# Patient Record
Sex: Female | Born: 2000 | Race: Black or African American | Hispanic: No | Marital: Single | Smoking: Never smoker
Health system: Southern US, Community
[De-identification: ages and names within clinical notes are randomized; demographics above are authoritative.]

## PROBLEM LIST (undated history)

## (undated) DIAGNOSIS — J45909 Unspecified asthma, uncomplicated: Secondary | ICD-10-CM

## (undated) HISTORY — PX: CYSTECTOMY: SUR359

---
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
## 2022-02-23 ENCOUNTER — Encounter (INDEPENDENT_AMBULATORY_CARE_PROVIDER_SITE_OTHER): Payer: Self-pay | Admitting: Pediatrics

## 2022-03-17 ENCOUNTER — Ambulatory Visit (INDEPENDENT_AMBULATORY_CARE_PROVIDER_SITE_OTHER): Payer: Medicaid Other | Admitting: Pediatrics

## 2022-03-17 ENCOUNTER — Encounter (INDEPENDENT_AMBULATORY_CARE_PROVIDER_SITE_OTHER): Payer: Self-pay | Admitting: Pediatrics

## 2022-03-17 VITALS — BP 106/66 | HR 76 | Ht 68.47 in | Wt 119.0 lb

## 2022-03-17 DIAGNOSIS — E1065 Type 1 diabetes mellitus with hyperglycemia: Secondary | ICD-10-CM | POA: Diagnosis not present

## 2022-03-17 DIAGNOSIS — E10649 Type 1 diabetes mellitus with hypoglycemia without coma: Secondary | ICD-10-CM | POA: Diagnosis not present

## 2022-03-17 DIAGNOSIS — E109 Type 1 diabetes mellitus without complications: Secondary | ICD-10-CM

## 2022-03-17 DIAGNOSIS — Z978 Presence of other specified devices: Secondary | ICD-10-CM | POA: Diagnosis not present

## 2022-03-17 LAB — POCT GLYCOSYLATED HEMOGLOBIN (HGB A1C): Hemoglobin A1C: 8.7 % — AB (ref 4.0–5.6)

## 2022-03-17 LAB — POCT GLUCOSE (DEVICE FOR HOME USE): Glucose Fasting, POC: 271 mg/dL — AB (ref 70–99)

## 2022-03-17 MED ORDER — FIASP FLEXTOUCH 100 UNIT/ML ~~LOC~~ SOPN: PEN_INJECTOR | SUBCUTANEOUS | 5 refills | Status: DC

## 2022-03-17 MED ORDER — LANTUS SOLOSTAR 100 UNIT/ML ~~LOC~~ SOPN: PEN_INJECTOR | SUBCUTANEOUS | 3 refills | Status: DC

## 2022-03-17 NOTE — Patient Instructions (Signed)
DISCHARGE INSTRUCTIONS FOR Diane [name]  03/17/2022  HbA1c Goals: Our ultimate goal is to achieve the lowest possible HbA1c while avoiding recurrent severe hypoglycemia.  However all HbA1c goals must be individualized per the American Diabetes Association Clinical Standards.  My Hemoglobin A1c History:  Lab Results  Component Value Date   HGBA1C 8.7 (A) 03/17/2022   HGBA1C 5.8 (A) 07/10/2021   HGBA1C 5.8 (A) 04/08/2021   HGBA1C 9.0 (A) 12/06/2020   HGBA1C 7.7 (A) 09/03/2020   HGBA1C >15.5 (H) 03/10/2020   HGBA1C >15.5 (H) 03/10/2020    My goal HbA1c is: < 7 %  This is equivalent to an average blood glucose of:  HbA1c % = Average BG  6  120   7  150   8  180   9  210   10  240   11  270   12  300   13  330    For pump site and CGM sites: Try adhesives and new site on back of arms and abdomen  -Remember not to enter glucose into Bolus Calc if your glucose is less than 120mg /dL Insulin:   DAILY SCHEDULE- In Case of Pump Failure, use Bolus Calc  Give Long Acting Insulin ASAP: 8 (can increase to 10) units of (Lantus/Glargine/Basaglar,Tresiba) every 24 hours   Breakfast: Get up Check Glucose Take insulin (Humalog (Lyumjev)/Novolog(FiASP)/)Apidra/Admelog) and then eat Give carbohydrate ratio: 1 unit for every 15 grams of carbs (# carbs divided by 15) Give correction if glucose > 120 mg/dL, [Glucose - divided by [75] Lunch: Check Glucose Take insulin (Humalog (Lyumjev)/Novolog(FiASP)/)Apidra/Admelog) and then eat Give carbohydrate ratio: 1 unit for every 15 grams of carbs (# carbs divided by 15) Give correction if glucose > 120 mg/dL (see table) Afternoon: If snack is eaten (optional): 1 unit for every 15 grams of carbs (# carbs divided by 15) Dinner: Check Glucose Take insulin (Humalog (Lyumjev)/Novolog(FiASP)/)Apidra/Admelog) and then eat Give carbohydrate ratio: 1 unit for every 20 grams of carbs (# carbs divided by 20) Give correction if glucose > 120 mg/dL (see  table) Bed: Check Glucose (Juice first if BG is less than__70 mg/dL____) Give correction if glucose > 160 mg/dL   -If glucose is 465] mg/dL or more, if snack is desired, then give carb ratio + HALF   correction dose         -If glucose is 125 mg/dL or less, give snack without insulin. NEVER go to bed with a glucose less than 90 mg/dL.  **Remember: Carbohydrate + Correction Dose = units of rapid acting insulin before eating **     Medications:  Continue as currently prescribed  Please allow 3 days for prescription refill requests! After hours are for emergencies only.   Check Blood Glucose:  Before breakfast, before lunch, before dinner, at bedtime, and for symptoms of high or low blood glucose as a minimum.  Check BG 2 hours after meals if adjusting doses.   Check more frequently on days with more activity than normal.   Check in the middle of the night when evening insulin doses are changed, on days with extra activity in the evening, and if you suspect overnight low glucoses are occurring.   Send a MyChart message as needed for patterns of high or low glucose levels, or multiple low glucoses.  As a general rule, ALWAYS call [phone] to review your child's blood glucoses IF: Your child has a seizure You have to use glucagon/Baqsimi/Gvoke or glucose gel to bring  up the blood sugar  IF you notice a pattern of high blood sugars  If in a week, your child has: 1 blood glucose that is 40 or less  2 blood glucoses that are 50 or less at the same time of day 3 blood glucoses that are 60 or less at the same time of day  Phone: [phone]  Ketones: Check urine or blood ketones if blood glucose is greater than 300 mg/dL (injections) or 240 mg/dL (pump), when ill, or if having symptoms of ketones.  Call if Urine Ketones are moderate or large Call if Blood Ketones are moderate (1-1.5) or large (more than1.5)  Exercise Plan:  Any activity that makes you sweat most days for 60 minutes.    Safety: Wear Medical Alert at [place] requesting the Yellow Dot Packages should contact [facility] at the [facility] by calling [phone] or e-mail [email or web address].  TEEN REMINDERS:  Check blood glucose before driving If sexually active, use reliable birth control including condoms.  Alcohol in moderation only - check glucoses more frequently, & have a snack with no carb coverage. Glucose gel/cake icing for low glucose. Check glucoses in the middle of the night.  Other: Schedule an eye exam yearly and a dental exam and cleaning every 6 months. Get a flu vaccine yearly, and Covid-19 vaccine unless contraindicated.

## 2022-03-17 NOTE — Progress Notes (Signed)
Pediatric Endocrinology Diabetes Consultation Follow-up Visit  Diane [name] Sep 18, 2000 [id number]  Chief Complaint: Follow-up Type 1 Diabetes    [name], MD   HPI: Diane [name]  is a 21 y.o. female presenting for follow-up of Type 1 Diabetes diagnosed 03/11/2020 with new onset diabetes after being seen by PCP one day prior to abdominal pain and sore throat.   In the ED she was found to have a blood glucose of 550 with BHB >8. Her pH was 7.01 with bicarb <7. She was admitted to the PICU for insulin GGT. She was started on Lantus and Novolog MDI and received diabetes education prior to discharge. 03/10/2020- Insulin ab <5, ICA neg, GAD 1,417.2. c.peptide 0.6. Omnipod 5 was  started 02/20/2021. She has contact dermatitis with Omnipod adhesive improved with flonase. She has nocturnal hypoglycemia with menses. she is accompanied to this visit by her mother.  Since last visit on 11/15/21, she has been well.  No ER visits or hospitalizations. She is attending college at [employer]. She stopped track. Omnipod has been falling off more and she has been using her legs, but not tried anywhere else. PDM is not charged and dead. No new inpen. She also feels CGM has been falling off more too. She is frustrated. She is giving fixed doses and taking pump breaks. She does not have any pods at home, and did not tell her mother. She still does not have her license.   Insulin regimen:  Basal: Lantus 4 units Bolus: Novo Fixed dose 7 units at breakfast, none at lunch, 7-8 dinner Eating in between doses.          Hypoglycemia: can feel most low blood sugars.  No glucagon needed recently.  Blood glucose download: Accucheck Guide meter CGM download: Using Dexcom G6 continuous glucose monitor   Med-alert ID: is currently wearing. Injection/Pump sites: lower extremity Annual labs due: October 2023 Annual Foot Exam: 11/15/21- nl Ophthalmology due: 2023 no retinopathy. Flu vaccine: 2022 COVID vaccine: x2,  Covid-19 x1  3. ROS: Greater than 10 systems reviewed with pertinent positives listed in HPI, otherwise neg.  The following portions of the patient's history were reviewed and updated as appropriate:  Past Medical History:  Past Medical History:  Diagnosis Date   Asthma     Medications:  Outpatient Encounter Medications as of 03/17/2022  Medication Sig   Accu-Chek FastClix Lancets MISC Check sugar up to 6 times daily. For use with FAST CLIX Lancet Device   albuterol (PROVENTIL HFA;VENTOLIN HFA) 108 (90 Base) MCG/ACT inhaler Inhale 2 puffs into the lungs every 4 (four) hours as needed for wheezing or shortness of breath.   Continuous Blood Gluc Sensor (DEXCOM G6 SENSOR) MISC Inject 1 applicator into the skin as directed. (change sensor every 10 days)   Continuous Blood Gluc Transmit (DEXCOM G6 TRANSMITTER) MISC Inject 1 Device into the skin as directed. (re-use up to 8x with each new sensor)   glucose blood (ACCU-CHEK GUIDE) test strip Use as instructed for 6 checks per day plus per protocol for hyper/hypoglycemia   insulin aspart (FIASP FLEXTOUCH) 100 UNIT/ML FlexTouch Pen Inject up to 50 units subcutaneously daily as instructed.   Insulin Aspart, w/Niacinamide, (FIASP) 100 UNIT/ML SOLN Inject up to 200 units every 2-3 days. Please fill for [facility] [facility].   Insulin Disposable Pump (OMNIPOD 5 G6 POD, GEN 5,) MISC Inject 1 Device into the skin as directed. Change pod every 2 days. Patient will need 3 boxes (each contain 5 pods) for a 30  day supply. Please fill for [facility] [id number].   acetone, urine, test strip Check ketones per protocol (Patient not taking: Reported on 03/26/2020)   Blood Glucose Monitoring Suppl (ACCU-CHEK GUIDE) w/Device KIT 1 each by Does not apply route as directed. (Patient not taking: Reported on 09/03/2020)   cetirizine (ZYRTEC) 10 MG tablet Take 10 mg by mouth daily as needed for allergies. (Patient not taking: Reported on 03/26/2020)   Continuous Blood Gluc Receiver  (DEXCOM G6 RECEIVER) DEVI 1 Device by Does not apply route as directed. (Patient not taking: Reported on 03/17/2022)   ferrous sulfate 325 (65 FE) MG tablet Take 1 tablet (325 mg total) by mouth daily. (Patient not taking: Reported on 07/10/2021)   Glucagon (BAQSIMI TWO PACK) 3 MG/DOSE POWD Place 1 each into the nose as needed (severe hypoglycmia with unresponsiveness). (Patient not taking: Reported on 04/23/2020)   hydrocortisone 1 % ointment Apply 1 application. topically 2 (two) times daily. (Patient not taking: Reported on 11/15/2021)   injection device for insulin (INPEN 100-GREY-NOVO) DEVI Use InPen with insulin cartridges to inject insulin up to 8x per day. (Patient not taking: Reported on 01/07/2021)   insulin glargine (LANTUS SOLOSTAR) 100 UNIT/ML Solostar Pen Up to 50 units per day as directed by MD, in case of pump failure.   Insulin Pen Needle (INSUPEN PEN NEEDLES) 32G X 4 MM MISC BD Pen Needles- brand specific. Inject insulin via insulin pen 6 x daily (Patient not taking: Reported on 02/20/2021)   Lancets Misc. (ACCU-CHEK FASTCLIX LANCET) KIT Check sugar 6 times daily (Patient not taking: Reported on 04/23/2020)   ondansetron (ZOFRAN-ODT) 4 MG disintegrating tablet Take 1 tablet (4 mg total) by mouth every 8 (eight) hours as needed for nausea or vomiting. (Patient not taking: Reported on 07/10/2021)   selenium sulfide (SELSUN) 2.5 % shampoo Apply topically. (Patient not taking: Reported on 01/07/2021)   [DISCONTINUED] insulin glargine (LANTUS SOLOSTAR) 100 UNIT/ML Solostar Pen Up to 50 units per day as directed by MD, in case of pump failure. (Patient not taking: Reported on 11/15/2021)   No facility-administered encounter medications on file as of 03/17/2022.    Allergies: No Known Allergies  Surgical History: Past Surgical History:  Procedure Laterality Date   CYSTECTOMY      Family History:  Family History  Problem Relation Age of Onset   Asthma Sister    Thyroid disease Maternal Aunt     Thyroid disease Maternal Grandmother    Anemia Maternal Grandmother    Asthma Maternal Grandfather    Diabetes Paternal Grandmother     Social History: Social History   Social History Narrative   Lives with parents and 4 siblings   Graduates May 2022. Going to [employer] for physical therapy.     Physical Exam:  Vitals:   03/17/22 0926  BP: 106/66  Pulse: 76  Weight: 119 lb (54 kg)  Height: 5' 8.47" (1.739 m)   BP 106/66   Pulse 76   Ht 5' 8.47" (1.739 m)   Wt 119 lb (54 kg)   LMP 03/12/2022   BMI 17.85 kg/m  Body mass index: body mass index is 17.85 kg/m. Blood pressure %iles are not available for patients who are 18 years or older.  Ht Readings from Last 3 Encounters:  03/17/22 5' 8.47" (1.739 m) (95 %, Z= 1.65)*  11/15/21 5' 8.47" (1.739 m) (95 %, Z= 1.65)*  10/01/21 5' 8.5" (1.74 m) (95 %, Z= 1.67)*   * Growth percentiles are based on CDC (Girls,  2-20 Years) data.   Wt Readings from Last 3 Encounters:  03/17/22 119 lb (54 kg) (35 %, Z= -0.38)*  11/15/21 122 lb 3.2 oz (55.4 kg) (44 %, Z= -0.16)*  10/01/21 125 lb 12.8 oz (57.1 kg) (51 %, Z= 0.04)*   * Growth percentiles are based on CDC (Girls, 2-20 Years) data.    Physical Exam Vitals reviewed.  Constitutional:      Appearance: Normal appearance. She is not toxic-appearing.  HENT:     Head: Normocephalic and atraumatic.     Nose: Nose normal.     Mouth/Throat:     Mouth: Mucous membranes are moist.  Eyes:     Extraocular Movements: Extraocular movements intact.  Neck:     Comments: No goiter Cardiovascular:     Heart sounds: Normal heart sounds.  Pulmonary:     Effort: Pulmonary effort is normal. No respiratory distress.     Breath sounds: Normal breath sounds.  Abdominal:     General: There is no distension.  Musculoskeletal:        General: Normal range of motion.     Cervical back: Normal range of motion and neck supple.  Skin:    Capillary Refill: Capillary refill takes less than 2 seconds.      Findings: No rash.     Comments: No lipohypertrophy.   Neurological:     General: No focal deficit present.     Mental Status: She is alert.     Gait: Gait normal.  Psychiatric:        Mood and Affect: Mood normal.        Behavior: Behavior normal.        Thought Content: Thought content normal.        Judgment: Judgment normal.      Labs: Lab Results  Component Value Date   ISLETAB Negative 03/10/2020  ,  Lab Results  Component Value Date   INSULINAB <5.0 03/10/2020  ,  Lab Results  Component Value Date   GLUTAMICACAB 1,417.2 (H) 03/10/2020  , No results found for: "ZNT8AB" No results found for: "LABIA2"  Last hemoglobin A1c:  Lab Results  Component Value Date   HGBA1C 8.7 (A) 03/17/2022   Results for orders placed or performed in visit on 03/17/22  POCT Glucose (Device for Home Use)  Result Value Ref Range   Glucose Fasting, POC 271 (A) 70 - 99 mg/dL   POC Glucose    POCT glycosylated hemoglobin (Hb A1C)  Result Value Ref Range   Hemoglobin A1C 8.7 (A) 4.0 - 5.6 %   HbA1c POC (<> result, manual entry)     HbA1c, POC (prediabetic range)     HbA1c, POC (controlled diabetic range)      Lab Results  Component Value Date   HGBA1C 8.7 (A) 03/17/2022   HGBA1C 5.8 (A) 07/10/2021   HGBA1C 5.8 (A) 04/08/2021    Lab Results  Component Value Date   MICROALBUR 1.6 04/08/2021   [place] 78 04/08/2021   CREATININE 0.85 04/08/2021    Assessment/Plan: Diane [name] is a 21 y.o. female with The primary encounter diagnosis was Uncontrolled type 1 diabetes mellitus with hyperglycemia ([place]). A diagnosis of Uses self-applied continuous glucose monitoring device was also pertinent to this visit.. Diabetes mellitus Type I, under poor control.A1c is above goal of 7% or lower and TIR is below goal of over 70%.  She is no longer running and less insulin sensitive, so she needs more basal insulin. She is complaining of  devices not sticking to her, though she has stopped track. We  discussed adhesive supplements and sample of Skin Tac provided. Also, handout on other adhesive supplements that can be used were also provided. I am not sure how she is receiving her rapid acting insulin as PDM is dead, she is not wearing a pod, and Inpen is expired. She received a sample FiASP pen and prescriptions for long acting and rapid acting insulin were provided. BolusCalc was downloaded to her phone and daily schedule provided. She has MyFitnessPal to carb count. We discussed how fixed doses were leading to hyperglycemia and then midafternoon hypoglycemia. She agreed to keep a diary on her phone of insulin doses and carbs in her Dexcom G6 app.  When a patient is on insulin, intensive monitoring of blood glucose levels and continuous insulin titration is vital to avoid hyperglycemia and hypoglycemia. Severe hypoglycemia can lead to seizure or death. Hyperglycemia can lead to ketosis requiring ICU admission and intravenous insulin.   -Annual studies fasting today as below:  Orders Placed This Encounter  Procedures   Microalbumin / creatinine urine ratio   Lipid panel   T4, free   TSH   POCT Glucose (Device for Home Use)   POCT glycosylated hemoglobin (Hb A1C)   COLLECTION CAPILLARY BLOOD SPECIMEN    Meds ordered this encounter  Medications   insulin glargine (LANTUS SOLOSTAR) 100 UNIT/ML Solostar Pen    Sig: Up to 50 units per day as directed by MD, in case of pump failure.    Dispense:  15 mL    Refill:  3    3 ml per pen. 5 pens per box. Please dispense 1 box of pens. For questions regarding this prescription please call [phone].   insulin aspart (FIASP FLEXTOUCH) 100 UNIT/ML FlexTouch Pen    Sig: Inject up to 50 units subcutaneously daily as instructed.    Dispense:  15 mL    Refill:  5     Patient Instructions  DISCHARGE INSTRUCTIONS FOR Diane [name]  03/17/2022  HbA1c Goals: Our ultimate goal is to achieve the lowest possible HbA1c while avoiding recurrent severe  hypoglycemia.  However all HbA1c goals must be individualized per the American Diabetes Association Clinical Standards.  My Hemoglobin A1c History:  Lab Results  Component Value Date   HGBA1C 8.7 (A) 03/17/2022   HGBA1C 5.8 (A) 07/10/2021   HGBA1C 5.8 (A) 04/08/2021   HGBA1C 9.0 (A) 12/06/2020   HGBA1C 7.7 (A) 09/03/2020   HGBA1C >15.5 (H) 03/10/2020   HGBA1C >15.5 (H) 03/10/2020    My goal HbA1c is: < 7 %  This is equivalent to an average blood glucose of:  HbA1c % = Average BG  6  120   7  150   8  180   9  210   10  240   11  270   12  300   13  330    For pump site and CGM sites: Try adhesives and new site on back of arms and abdomen  -Remember not to enter glucose into Bolus Calc if your glucose is less than 190m/dL Insulin:   DAILY SCHEDULE- In Case of Pump Failure, use Bolus Calc  Give Long Acting Insulin ASAP: 8 (can increase to 10) units of (Lantus/Glargine/Basaglar,Tresiba) every 24 hours   Breakfast: Get up Check Glucose Take insulin (Humalog (Lyumjev)/Novolog(FiASP)/)Apidra/Admelog) and then eat Give carbohydrate ratio: 1 unit for every 15 grams of carbs (# carbs divided by 15) Give correction  if glucose > 120 mg/dL, [Glucose - 120] divided by [75] Lunch: Check Glucose Take insulin (Humalog (Lyumjev)/Novolog(FiASP)/)Apidra/Admelog) and then eat Give carbohydrate ratio: 1 unit for every 15 grams of carbs (# carbs divided by 15) Give correction if glucose > 120 mg/dL (see table) Afternoon: If snack is eaten (optional): 1 unit for every 15 grams of carbs (# carbs divided by 15) Dinner: Check Glucose Take insulin (Humalog (Lyumjev)/Novolog(FiASP)/)Apidra/Admelog) and then eat Give carbohydrate ratio: 1 unit for every 20 grams of carbs (# carbs divided by 20) Give correction if glucose > 120 mg/dL (see table) Bed: Check Glucose (Juice first if BG is less than__70 mg/dL____) Give correction if glucose > 160 mg/dL   -If glucose is 125 mg/dL or more, if  snack is desired, then give carb ratio + HALF   correction dose         -If glucose is 125 mg/dL or less, give snack without insulin. NEVER go to bed with a glucose less than 90 mg/dL.  **Remember: Carbohydrate + Correction Dose = units of rapid acting insulin before eating **     Medications:  Continue as currently prescribed  Please allow 3 days for prescription refill requests! After hours are for emergencies only.   Check Blood Glucose:  Before breakfast, before lunch, before dinner, at bedtime, and for symptoms of high or low blood glucose as a minimum.  Check BG 2 hours after meals if adjusting doses.   Check more frequently on days with more activity than normal.   Check in the middle of the night when evening insulin doses are changed, on days with extra activity in the evening, and if you suspect overnight low glucoses are occurring.   Send a MyChart message as needed for patterns of high or low glucose levels, or multiple low glucoses.  As a general rule, ALWAYS call us to review your child's blood glucoses IF: Your child has a seizure You have to use glucagon/Baqsimi/Gvoke or glucose gel to bring up the blood sugar  IF you notice a pattern of high blood sugars  If in a week, your child has: 1 blood glucose that is 40 or less  2 blood glucoses that are 50 or less at the same time of day 3 blood glucoses that are 60 or less at the same time of day  Phone: [phone]  Ketones: Check urine or blood ketones if blood glucose is greater than 300 mg/dL (injections) or 240 mg/dL (pump), when ill, or if having symptoms of ketones.  Call if Urine Ketones are moderate or large Call if Blood Ketones are moderate (1-1.5) or large (more than1.5)  Exercise Plan:  Any activity that makes you sweat most days for 60 minutes.   Safety: Wear Medical Alert at [place] requesting the Yellow Dot Packages should contact [facility] at the [facility] by  calling [phone] or e-mail [email or web address].  TEEN REMINDERS:  Check blood glucose before driving If sexually active, use reliable birth control including condoms.  Alcohol in moderation only - check glucoses more frequently, & have a snack with no carb coverage. Glucose gel/cake icing for low glucose. Check glucoses in the middle of the night.  Other: Schedule an eye exam yearly and a dental exam and cleaning every 6 months. Get a flu vaccine yearly, and Covid-19 vaccine unless contraindicated.        Follow-up:   Return in about 2 weeks (around 03/31/2022), or if symptoms worsen or fail to  improve, for follow up.    Medical decision-making:  I spent 49 minutes dedicated to the care of this patient on the date of this encounter to include pre-visit review of laboratory studies, glucose logs/continuous glucose monitor logs, pump downloads,  medically appropriate exam, face-to-face time with the patient, ordering of medications, ordering of tests, and documenting in the EHR.  Thank you for the opportunity to participate in the care of our mutual patient. Please do not hesitate to contact me should you have any questions regarding the assessment or treatment plan.   Sincerely,   Al Corpus, MD

## 2022-03-18 LAB — LIPID PANEL
Cholesterol: 194 mg/dL — ABNORMAL HIGH (ref <170)
HDL: 57 mg/dL (ref >45)
LDL Cholesterol (Calc): 118 mg/dL (calc) — ABNORMAL HIGH (ref <110)
Non-HDL Cholesterol (Calc): 137 mg/dL (calc) — ABNORMAL HIGH (ref <120)
Total CHOL/HDL Ratio: 3.4 (calc) (ref <5.0)
Triglycerides: 87 mg/dL (ref <90)

## 2022-03-18 LAB — MICROALBUMIN / CREATININE URINE RATIO
Creatinine, Urine: 131 mg/dL (ref 20–275)
Microalb Creat Ratio: 2 mcg/mg creat (ref <30)
Microalb, Ur: 0.2 mg/dL

## 2022-03-18 LAB — T4, FREE: Free T4: 1.2 ng/dL (ref 0.8–1.4)

## 2022-03-18 LAB — TSH: TSH: 1.66 mIU/L

## 2022-03-31 ENCOUNTER — Ambulatory Visit (INDEPENDENT_AMBULATORY_CARE_PROVIDER_SITE_OTHER): Payer: Self-pay | Admitting: Pediatrics

## 2022-03-31 ENCOUNTER — Encounter (INDEPENDENT_AMBULATORY_CARE_PROVIDER_SITE_OTHER): Payer: Self-pay | Admitting: Pediatrics

## 2022-03-31 ENCOUNTER — Encounter (INDEPENDENT_AMBULATORY_CARE_PROVIDER_SITE_OTHER): Payer: Self-pay

## 2022-04-01 ENCOUNTER — Ambulatory Visit (INDEPENDENT_AMBULATORY_CARE_PROVIDER_SITE_OTHER): Payer: Medicaid Other | Admitting: Pediatrics

## 2022-04-01 ENCOUNTER — Encounter (INDEPENDENT_AMBULATORY_CARE_PROVIDER_SITE_OTHER): Payer: Self-pay | Admitting: Pediatrics

## 2022-04-01 VITALS — BP 112/70 | HR 97 | Wt 109.8 lb

## 2022-04-01 DIAGNOSIS — E1065 Type 1 diabetes mellitus with hyperglycemia: Secondary | ICD-10-CM | POA: Diagnosis not present

## 2022-04-01 DIAGNOSIS — R824 Acetonuria: Secondary | ICD-10-CM

## 2022-04-01 DIAGNOSIS — E785 Hyperlipidemia, unspecified: Secondary | ICD-10-CM | POA: Diagnosis not present

## 2022-04-01 LAB — POCT URINALYSIS DIPSTICK (MANUAL)

## 2022-04-01 LAB — POCT GLUCOSE (DEVICE FOR HOME USE): POC Glucose: 516 mg/dl — AB (ref 70–99)

## 2022-04-01 NOTE — Progress Notes (Signed)
Pediatric Endocrinology Diabetes Consultation Follow-up Visit  Diane [name] [birth year] [id number]  Chief Complaint: Follow-up Type 1 Diabetes    [name], MD   HPI: Diane [name]  is a 21 y.o. female presenting for follow-up of Type 1 Diabetes diagnosed 03/11/2020 with new onset diabetes after being seen by PCP one day prior to abdominal pain and sore throat.   In the ED she was found to have a blood glucose of 550 with BHB >8. Her pH was 7.01 with bicarb <7. She was admitted to the PICU for insulin GGT. She was started on Lantus and Novolog MDI and received diabetes education prior to discharge. 03/10/2020- Insulin ab <5, ICA neg, GAD 1,417.2. c.peptide 0.6. Omnipod 5 was  started 02/20/2021. She has contact dermatitis with Omnipod adhesive improved with flonase. She has nocturnal hypoglycemia with menses. she is accompanied to this visit by her  sister .  Since last visit on 03/17/22, she has been well.  No ER visits or hospitalizations. Last dose of insulin 2-3 days ago. Thought she "didn't need long acting anymore." Denied SI/SH, but does not leave the house unless with family or for school. Declined to talk to someone. Does onto want to be admitted. Insulin regimen: Apps: Bolus calc and MyFitness pal --> documenting carbs in Dexcom app Basal: Lantus 8-10 units Bolus: Novo  CR: 15   ISF: 75  Target: 120 Not using Omnipod.          Hypoglycemia: can feel most low blood sugars.  No glucagon needed recently.  Blood glucose download: Accucheck Guide meter. Left meter at home, though my office reminded her yetserday to bring it today CGM download: Not wearing Dexcom G6 continuous glucose monitor   Med-alert ID: is currently wearing. Injection/Pump sites: lower extremity Annual labs due 03/2023: October 2023 with LDL 118, rest wnl. Next lipid panel January 2024 Annual Foot Exam: 11/15/21- nl Ophthalmology due: 2023 no retinopathy. Flu vaccine: 2022 COVID vaccine: x2, Covid-19  x1  3. ROS: Greater than 10 systems reviewed with pertinent positives listed in HPI, otherwise neg.  The following portions of the patient's history were reviewed and updated as appropriate:  Past Medical History:  Past Medical History:  Diagnosis Date   Asthma     Medications:  Outpatient Encounter Medications as of 04/01/2022  Medication Sig   Continuous Blood Gluc Sensor (DEXCOM G6 SENSOR) MISC Inject 1 applicator into the skin as directed. (change sensor every 10 days)   Continuous Blood Gluc Transmit (DEXCOM G6 TRANSMITTER) MISC Inject 1 Device into the skin as directed. (re-use up to 8x with each new sensor)   Insulin Aspart, w/Niacinamide, (FIASP) 100 UNIT/ML SOLN Inject up to 200 units every 2-3 days. Please fill for [employer] [facility].   Accu-Chek FastClix Lancets MISC Check sugar up to 6 times daily. For use with FAST CLIX Lancet Device (Patient not taking: Reported on 04/01/2022)   acetone, urine, test strip Check ketones per protocol (Patient not taking: Reported on 03/26/2020)   albuterol (PROVENTIL HFA;VENTOLIN HFA) 108 (90 Base) MCG/ACT inhaler Inhale 2 puffs into the lungs every 4 (four) hours as needed for wheezing or shortness of breath. (Patient not taking: Reported on 04/01/2022)   Blood Glucose Monitoring Suppl (ACCU-CHEK GUIDE) w/Device KIT 1 each by Does not apply route as directed. (Patient not taking: Reported on 09/03/2020)   cetirizine (ZYRTEC) 10 MG tablet Take 10 mg by mouth daily as needed for allergies. (Patient not taking: Reported on 03/26/2020)   Continuous Blood Gluc Receiver (  DEXCOM G6 RECEIVER) DEVI 1 Device by Does not apply route as directed. (Patient not taking: Reported on 03/17/2022)   ferrous sulfate 325 (65 FE) MG tablet Take 1 tablet (325 mg total) by mouth daily. (Patient not taking: Reported on 07/10/2021)   Glucagon (BAQSIMI TWO PACK) 3 MG/DOSE POWD Place 1 each into the nose as needed (severe hypoglycmia with unresponsiveness). (Patient not taking:  Reported on 04/23/2020)   glucose blood (ACCU-CHEK GUIDE) test strip Use as instructed for 6 checks per day plus per protocol for hyper/hypoglycemia (Patient not taking: Reported on 04/01/2022)   hydrocortisone 1 % ointment Apply 1 application. topically 2 (two) times daily. (Patient not taking: Reported on 11/15/2021)   injection device for insulin (INPEN 100-GREY-NOVO) DEVI Use InPen with insulin cartridges to inject insulin up to 8x per day. (Patient not taking: Reported on 01/07/2021)   insulin aspart (FIASP FLEXTOUCH) 100 UNIT/ML FlexTouch Pen Inject up to 50 units subcutaneously daily as instructed. (Patient not taking: Reported on 04/01/2022)   Insulin Disposable Pump (OMNIPOD 5 G6 POD, GEN 5,) MISC Inject 1 Device into the skin as directed. Change pod every 2 days. Patient will need 3 boxes (each contain 5 pods) for a 30 day supply. [facility] [id number]. (Patient not taking: Reported on 04/01/2022)   insulin glargine (LANTUS SOLOSTAR) 100 UNIT/ML Solostar Pen Up to 50 units per day as directed by MD, in case of pump failure. (Patient not taking: Reported on 04/01/2022)   Insulin Pen Needle (INSUPEN PEN NEEDLES) 32G X 4 MM MISC BD Pen Needles- brand specific. Inject insulin via insulin pen 6 x daily (Patient not taking: Reported on 02/20/2021)   Lancets Misc. (ACCU-CHEK FASTCLIX LANCET) KIT Check sugar 6 times daily (Patient not taking: Reported on 04/23/2020)   ondansetron (ZOFRAN-ODT) 4 MG disintegrating tablet Take 1 tablet (4 mg total) by mouth every 8 (eight) hours as needed for nausea or vomiting. (Patient not taking: Reported on 07/10/2021)   selenium sulfide (SELSUN) 2.5 % shampoo Apply topically. (Patient not taking: Reported on 01/07/2021)   No facility-administered encounter medications on file as of 04/01/2022.    Allergies: No Known Allergies  Surgical History: Past Surgical History:  Procedure Laterality Date   CYSTECTOMY      Family History:  Family History   Problem Relation Age of Onset   Asthma Sister    Thyroid disease Maternal Aunt    Thyroid disease Maternal Grandmother    Anemia Maternal Grandmother    Asthma Maternal Grandfather    Diabetes Paternal Grandmother     Social History: Social History   Social History Narrative   Lives with parents and 4 siblings   Graduates May 2022. Going to [employer] [facility] for physical therapy.     Physical Exam:  Vitals:   04/01/22 1354  BP: 112/70  Pulse: 97  Weight: 109 lb 12.8 oz (49.8 kg)   BP 112/70   Pulse 97   Wt 109 lb 12.8 oz (49.8 kg)   LMP 03/12/2022   BMI 16.47 kg/m  Body mass index: body mass index is 16.47 kg/m. Blood pressure %iles are not available for patients who are 18 years or older.  Ht Readings from Last 3 Encounters:  03/17/22 5' 8.47" (1.739 m) (95 %, Z= 1.65)*  11/15/21 5' 8.47" (1.739 m) (95 %, Z= 1.65)*  10/01/21 5' 8.5" (1.74 m) (95 %, Z= 1.67)*   * Growth percentiles are based on CDC (Girls, 2-20 Years) data.   Wt Readings from Last 3  Encounters:  04/01/22 109 lb 12.8 oz (49.8 kg) (17 %, Z= -0.97)*  03/17/22 119 lb (54 kg) (35 %, Z= -0.38)*  11/15/21 122 lb 3.2 oz (55.4 kg) (44 %, Z= -0.16)*   * Growth percentiles are based on CDC (Girls, 2-20 Years) data.    Physical Exam Vitals reviewed.  Constitutional:      Appearance: Normal appearance. She is not toxic-appearing.  HENT:     Head: Normocephalic and atraumatic.     Nose: Nose normal.     Mouth/Throat:     Mouth: Mucous membranes are moist.  Eyes:     Extraocular Movements: Extraocular movements intact.  Neck:     Comments: No goiter Cardiovascular:     Heart sounds: Normal heart sounds.  Pulmonary:     Effort: Pulmonary effort is normal. No respiratory distress.     Breath sounds: Normal breath sounds.  Abdominal:     General: There is no distension.  Musculoskeletal:        General: Normal range of motion.     Cervical back: Normal range of motion and neck supple.  Skin:    Capillary  Refill: Capillary refill takes less than 2 seconds.     Findings: No rash.     Comments: No lipohypertrophy.   Neurological:     General: No focal deficit present.     Mental Status: She is alert.     Gait: Gait normal.  Psychiatric:        Mood and Affect: Mood normal.        Behavior: Behavior normal.        Thought Content: Thought content normal.        Judgment: Judgment normal.      Labs: Lab Results  Component Value Date   ISLETAB Negative 03/10/2020  ,  Lab Results  Component Value Date   INSULINAB <5.0 03/10/2020  ,  Lab Results  Component Value Date   GLUTAMICACAB 1,417.2 (H) 03/10/2020  , No results found for: "ZNT8AB" No results found for: "LABIA2"  Last hemoglobin A1c:  Lab Results  Component Value Date   HGBA1C 8.7 (A) 03/17/2022   Results for orders placed or performed in visit on 04/01/22  POCT Glucose (Device for Home Use)  Result Value Ref Range   Glucose Fasting, POC     POC Glucose 516 (A) 70 - 99 mg/dl  POCT Urinalysis Dip Manual  Result Value Ref Range   Spec Grav, UA     pH, UA     Leukocytes, UA     Nitrite, UA     Poct Protein     Poct Glucose     Poct Ketones +++ large (A) Negative   Poct Urobilinogen     Poct Bilirubin     Poct Blood      Lab Results  Component Value Date   HGBA1C 8.7 (A) 03/17/2022   HGBA1C 5.8 (A) 07/10/2021   HGBA1C 5.8 (A) 04/08/2021    Lab Results  Component Value Date   MICROALBUR 0.2 03/17/2022   LDLCALC 118 (H) 03/17/2022   CREATININE 0.85 04/08/2021    Assessment/Plan: Diane [name] is a 21 y.o. female with The primary encounter diagnosis was Uncontrolled type 1 diabetes mellitus with hyperglycemia ([place]). Diagnoses of Ketonuria and Hyperlipidemia LDL goal <100 were also pertinent to this visit.. Diabetes mellitus Type I, under poor control.A1c is above goal of 7% or lower and TIR is below goal of over 70%.  She is no longer  running and less insulin sensitive, so she needs more basal insulin. She is  complaining of devices not sticking to her, though she has stopped track. We discussed adhesive supplements and sample of Skin Tac provided. Also, handout on other adhesive supplements that can be used were also provided. I am not sure how she is receiving her rapid acting insulin as PDM is dead, she is not wearing a pod, and Inpen is expired. She received a sample FiASP pen and prescriptions for long acting and rapid acting insulin were provided. BolusCalc was downloaded to her phone and daily schedule provided. She has MyFitnessPal to carb count. We discussed how fixed doses were leading to hyperglycemia and then midafternoon hypoglycemia. She agreed to keep a diary on her phone of insulin doses and carbs in her Dexcom G6 app.  When a patient is on insulin, intensive monitoring of blood glucose levels and continuous insulin titration is vital to avoid hyperglycemia and hypoglycemia. Severe hypoglycemia can lead to seizure or death. Hyperglycemia can lead to ketosis requiring ICU admission and intravenous insulin.   1. Uncontrolled type 1 diabetes mellitus with hyperglycemia ([facility]) -Gave Basaglar 10 units via sample pen. Good technique - COLLECTION CAPILLARY BLOOD SPECIMEN - POCT Glucose (Device for Home Use) -elevated  - POCT Urinalysis Dip Manual -c/o evolving depression, will refer to [facility] at next visit if provider hired  -alarm set on phone for 7AM to take long acting -She did not want a simplified insulin regimen and wanted to count carbs -provided education on diabetes and reviewed daily schedule (pictures on phone too) -reviewed sick day management and handout provided  2. Ketonuria -denied HA/abdominal pain. No kussmaul and appears hydrated --> no concerns of DKA -Gave 10 units of FiASP for large ketonuria and hyperglycemia and 20% TDD to prevent hospitalization -Snack provided in case of low on way home  3. Hyperlipidemia LDL goal <100 -LDL goal less than 100, she wants to work on  improving glycemic control before starting statin -Fasting lipid panel due January 2024  Orders Placed This Encounter  Procedures   POCT Glucose (Device for Home Use)   POCT Urinalysis Dip Manual   COLLECTION CAPILLARY BLOOD SPECIMEN    No orders of the defined types were placed in this encounter.    Patient Instructions  DISCHARGE INSTRUCTIONS FOR Diane [name]  04/01/2022  HbA1c Goals: Our ultimate goal is to achieve the lowest possible HbA1c while avoiding recurrent severe hypoglycemia.  However all HbA1c goals must be individualized per the American Diabetes Association Clinical Standards.  My Hemoglobin A1c History:  Lab Results  Component Value Date   HGBA1C 8.7 (A) 03/17/2022   HGBA1C 5.8 (A) 07/10/2021   HGBA1C 5.8 (A) 04/08/2021   HGBA1C 9.0 (A) 12/06/2020   HGBA1C 7.7 (A) 09/03/2020   HGBA1C >15.5 (H) 03/10/2020   HGBA1C >15.5 (H) 03/10/2020    My goal HbA1c is: < 7 %  This is equivalent to an average blood glucose of:  HbA1c % = Average BG  6  120   7  150   8  180   9  210   10  240   11  270   12  300   13  330    Insulin:  DIABETES PLAN  Rapid Acting Insulin (Novolog/FiASP (Aspart) and Humalog/Lyumjev (Lispro))  **Given for Food/Carbohydrates and High Sugar/Glucose**   DAYTIME (breakfast, lunch, dinner) Target Blood Glucose 125 mg/dL Insulin Sensitivity Factor 75 Insulin to Carb Ratio  1 unit  for 15 grams   Correction DOSE Food DOSE  (Glucose -Target)/Insulin Sensitivity Factor  Glucose (mg/dL) Units of Rapid Acting Insulin  Less than 125 0  126-200 1  201-275 2  276-350 3  351-425 4  426-500 5  501-575 6  576 or more 7   Number of carbohydrates divided by carb ratio  Number of Carbs Units of Rapid Acting Insulin  0-14 0  15-29 1  30-44 2  45-59 3  60-74 4  75-89 5  90-104 6  105-119 7  120-134 8  135-149 9  150-164 10  165-179 11  180-194 12  195+  (# carbs divided by 15)                 **Correction Dose + Food Dose =  Number of units of rapid acting insulin **  Correction for High Sugar/Glucose Food/Carbohydrate  Measure Blood Glucose BEFORE you eat. (Fingerstick with Glucose Meter or check the reading on your Continuous Glucose Meter).  Use the table above or calculate the dose using the formula.  Add this dose to the Food/Carbohydrate dose if eating a meal.  Correction should not be given sooner than every 3 hours since the last dose of rapid acting insulin. 1. Count the number of carbohydrates you will be eating.  2. Use the table above or calculate the dose using the formula.  3. Add this dose to the Correction dose if glucose is above target.         BEDTIME Target Blood Glucose 200 mg/dL Insulin Sensitivity Factor 75 Insulin to Carb Ratio  1 unit for 15 grams   Wait at least 3 hours after taking dinner dose of insulin BEFORE checking bedtime glucose.   Blood Sugar Less Than  178m/dL? Blood Sugar Between 126 - 2066mdL? Blood Sugar Greater Than 20077mL?  You MUST EAT 15 carbs  1. Carb snack not needed  Carb snack not needed    2. Additional, Optional Carb Snack?  If you want more carbs, you CAN eat them now! Make sure to subtract MUST EAT carbs from total carbs then look at chart below to determine food dose. 2. Optional Carb Snack?   You CAN eat this! Make sure to add up total carbs then look at chart below to determine food dose. 2. Optional Carb Snack?   You CAN eat this! Make sure to add up total carbs then look at chart below to determine food dose.  3. Correction Dose of Insulin?  NO  3. Correction Dose of Insulin?  NO 3. Correction Dose of Insulin?  YES; please look at correction dose chart to determine correction dose.   Glucose (mg/dL) Units of Rapid Acting Insulin  Less than 200 0  201-275 1  276-350 2  351-425 3  426-500 4  501-575 5  576 or more 6   Number of Carbs Units of Rapid Acting Insulin  0-14 0  15-29 1  30-44 2  45-59 3  60-74 4  75-89 5   90-104 6  105-119 7  120-134 8  135-149 9  150-164 10  165-179 11  180-194 12  195+  (# carbs divided by 15)          Long Acting Insulin (Glargine (Basaglar/Lantus/Semglee)/Levemir/Tresiba)  **Remember long acting insulin must be given EVERY DAY, and NEVER skip this dose**  Give 10 units at 7AM    If you have any questions/concerns PLEASE call [phone] to speak to the on-call  Pediatric Endocrinology provider at [facility].  Al Corpus, MD   DAILY SCHEDULE- Use Bolus Calc  Breakfast: Get up Check Glucose Take Lantus/Basaglar 10 units at 7AM Take insulin (Humalog (Lyumjev)/Novolog(FiASP)/)Apidra/Admelog) and then eat Give carbohydrate ratio: 1 unit for every 15 grams of carbs (# carbs divided by 15) Give correction if glucose > 120 mg/dL, [Glucose - 120] divided by [75] Lunch: Check Glucose Take insulin (Humalog (Lyumjev)/Novolog(FiASP)/)Apidra/Admelog) and then eat Give carbohydrate ratio: 1 unit for every 15 grams of carbs (# carbs divided by 15) Give correction if glucose > 120 mg/dL (see table) Afternoon: If snack is eaten (optional): 1 unit for every 15 grams of carbs (# carbs divided by 15) Dinner: Check Glucose Take insulin (Humalog (Lyumjev)/Novolog(FiASP)/)Apidra/Admelog) and then eat Give carbohydrate ratio: 1 unit for every 20 grams of carbs (# carbs divided by 20) Give correction if glucose > 120 mg/dL (see table) Bed: Check Glucose (Juice first if BG is less than__70 mg/dL____) Give correction if glucose > 160 mg/dL   -If glucose is 125 mg/dL or more, if snack is desired, then give carb ratio + HALF   correction dose         -If glucose is 125 mg/dL or less, give snack without insulin. NEVER go to bed with a glucose less than 90 mg/dL.  **Remember: Carbohydrate + Correction Dose = units of rapid acting insulin before eating **    Medications:   Continue as currently prescribed  Please allow  3 days for prescription refill requests! After hours are for emergencies only.   Check Blood Glucose:  Before breakfast, before lunch, before dinner, at bedtime, and for symptoms of high or low blood glucose as a minimum.  Check BG 2 hours after meals if adjusting doses.   Check more frequently on days with more activity than normal.   Check in the middle of the night when evening insulin doses are changed, on days with extra activity in the evening, and if you suspect overnight low glucoses are occurring.   Send a MyChart message as needed for patterns of high or low glucose levels, or multiple low glucoses.  As a general rule, ALWAYS call us to review your child's blood glucoses IF: Your child has a seizure You have to use glucagon/Baqsimi/Gvoke or glucose gel to bring up the blood sugar  IF you notice a pattern of high blood sugars  If in a week, your child has: 1 blood glucose that is 40 or less  2 blood glucoses that are 50 or less at the same time of day 3 blood glucoses that are 60 or less at the same time of day  Phone: [phone]  Ketones: Check urine or blood ketones if blood glucose is greater than 300 mg/dL (injections) or 240 mg/dL (pump), when ill, or if having symptoms of ketones.  Call if Urine Ketones are moderate or large Call if Blood Ketones are moderate (1-1.5) or large (more than1.5)  Exercise Plan:  Any activity that makes you sweat most days for 60 minutes.   Safety: Wear Medical Alert at [place] requesting the Yellow Dot Packages should contact [facility] at the [facility] by calling [phone] or e-mail [email or web address].  TEEN REMINDERS:  Check blood glucose before driving If sexually active, use reliable birth control including condoms.  Alcohol in moderation only -  check glucoses more frequently, & have a snack with no carb coverage. Glucose gel/cake icing for low glucose. Check glucoses in the  middle of the night.  Other: Schedule an eye exam yearly and a dental exam and cleaning every 6 months. Get a flu vaccine yearly, and Covid-19 vaccine unless contraindicated.   SICK DAY GUIDELINES  Remember the following 4 rules: Don't stop taking insulin--doses may need to be adjusted if not eating or blood sugar is low, but NEVER skip a dose! Correction dose of rapid acting insulin can be given every 3 hours. Check blood sugar levels more frequently--every 2 to 4 hours. Test for urine ketones EVERY time your child urinates. Give/offer lots of fluids/water.  If on a pump: "When in doubt, pull it out." Give insulin injection via insulin pen and needle Check urine for ketones Change pump site Give Ondansetron/Zofran if unable to keep down fluids Recheck glucose in 2-3 hours If not coming down, call the diabetes doctor  WHEN TO [place]: When an infection is suspected, fever, and for anything not related to diabetes  When to call the diabetes doctor: If your child vomits more than once or refuses food, If urine ketones are moderate or large, If blood sugars are over 200 most of the day or below 80, If steroids have been started for asthma (pediapred, orapred, prednisolone, prednisone).  Diarrhea and vomiting require replacement of fluids and minerals.  If your child is unable to eat solid foods because of nausea, clear liquids should be offered frequently.  It is important to keep your child well hydrated!    For blood sugars less than 150.  FLUIDS:     Foods: -Regular soda   -Regular jello -Gatorade   -Cooked cereal -PowerAde   -Plain yogurt -Juice    -Mashed potatoes -Regular popsicles  - cup ice cream or sherbet -Soup/broth   -toast/saltines    - banana   For blood sugars above 150.  FLUIDS:    Foods:    -Diet soda   -Sugar free jello -Zero Powerade/Gatorade  -Sugar free popsicles   -Water    -Sugar free foods  -Unsweetened tea -Other no-carb  fluids      Follow-up:   Return in about 4 weeks (around 04/29/2022), or if symptoms worsen or fail to improve, for follow up.    Medical decision-making:  I spent 49 minutes dedicated to the care of this patient on the date of this encounter to include pre-visit review of laboratory studies, glucose logs/continuous glucose monitor logs, pump downloads,  medically appropriate exam, face-to-face time with the patient, ordering of medications, ordering of tests, and documenting in the EHR.  Thank you for the opportunity to participate in the care of our mutual patient. Please do not hesitate to contact me should you have any questions regarding the assessment or treatment plan.   Sincerely,   Al Corpus, MD

## 2022-04-01 NOTE — Patient Instructions (Addendum)
DISCHARGE INSTRUCTIONS FOR Diane [name]  04/01/2022  HbA1c Goals: Our ultimate goal is to achieve the lowest possible HbA1c while avoiding recurrent severe hypoglycemia.  However all HbA1c goals must be individualized per the American Diabetes Association Clinical Standards.  My Hemoglobin A1c History:  Lab Results  Component Value Date   HGBA1C 8.7 (A) 03/17/2022   HGBA1C 5.8 (A) 07/10/2021   HGBA1C 5.8 (A) 04/08/2021   HGBA1C 9.0 (A) 12/06/2020   HGBA1C 7.7 (A) 09/03/2020   HGBA1C >15.5 (H) 03/10/2020   HGBA1C >15.5 (H) 03/10/2020    My goal HbA1c is: < 7 %  This is equivalent to an average blood glucose of:  HbA1c % = Average BG  6  120   7  150   8  180   9  210   10  240   11  270   12  300   13  330    Insulin:  DIABETES PLAN  Rapid Acting Insulin (Novolog/FiASP (Aspart) and Humalog/Lyumjev (Lispro))  **Given for Food/Carbohydrates and High Sugar/Glucose**   DAYTIME (breakfast, lunch, dinner) Target Blood Glucose 125 mg/dL Insulin Sensitivity Factor 75 Insulin to Carb Ratio  1 unit for 15 grams   Correction DOSE Food DOSE  (Glucose -Target)/Insulin Sensitivity Factor  Glucose (mg/dL) Units of Rapid Acting Insulin  Less than 125 0  126-200 1  201-275 2  276-350 3  351-425 4  426-500 5  501-575 6  576 or more 7   Number of carbohydrates divided by carb ratio  Number of Carbs Units of Rapid Acting Insulin  0-14 0  15-29 1  30-44 2  45-59 3  60-74 4  75-89 5  90-104 6  105-119 7  120-134 8  135-149 9  150-164 10  165-179 11  180-194 12  195+  (# carbs divided by 15)                 **Correction Dose + Food Dose = Number of units of rapid acting insulin **  Correction for High Sugar/Glucose Food/Carbohydrate  Measure Blood Glucose BEFORE you eat. (Fingerstick with Glucose Meter or check the reading on your Continuous Glucose Meter).  Use the table above or calculate the dose using the formula.  Add this dose to the Food/Carbohydrate dose  if eating a meal.  Correction should not be given sooner than every 3 hours since the last dose of rapid acting insulin. 1. Count the number of carbohydrates you will be eating.  2. Use the table above or calculate the dose using the formula.  3. Add this dose to the Correction dose if glucose is above target.         BEDTIME Target Blood Glucose 200 mg/dL Insulin Sensitivity Factor 75 Insulin to Carb Ratio  1 unit for 15 grams   Wait at least 3 hours after taking dinner dose of insulin BEFORE checking bedtime glucose.   Blood Sugar Less Than  125mg /dL? Blood Sugar Between 126 - 200mg /dL? Blood Sugar Greater Than 200mg /dL?  You MUST EAT 15 carbs  1. Carb snack not needed  Carb snack not needed    2. Additional, Optional Carb Snack?  If you want more carbs, you CAN eat them now! Make sure to subtract MUST EAT carbs from total carbs then look at chart below to determine food dose. 2. Optional Carb Snack?   You CAN eat this! Make sure to add up total carbs then look at chart below to determine  food dose. 2. Optional Carb Snack?   You CAN eat this! Make sure to add up total carbs then look at chart below to determine food dose.  3. Correction Dose of Insulin?  NO  3. Correction Dose of Insulin?  NO 3. Correction Dose of Insulin?  YES; please look at correction dose chart to determine correction dose.   Glucose (mg/dL) Units of Rapid Acting Insulin  Less than 200 0  201-275 1  276-350 2  351-425 3  426-500 4  501-575 5  576 or more 6   Number of Carbs Units of Rapid Acting Insulin  0-14 0  15-29 1  30-44 2  45-59 3  60-74 4  75-89 5  90-104 6  105-119 7  120-134 8  135-149 9  150-164 10  165-179 11  180-194 12  195+  (# carbs divided by 15)          Long Acting Insulin (Glargine (Basaglar/Lantus/Semglee)/Levemir/Tresiba)  **Remember long acting insulin must be given EVERY DAY, and NEVER skip this dose**                                    Give 10  units at 7AM    If you have any questions/concerns PLEASE call [phone] to speak to the on-call  Pediatric Endocrinology provider at [facility].  Al Corpus, MD   DAILY SCHEDULE- Use Bolus Calc  Breakfast: Get up Check Glucose Take Lantus/Basaglar 10 units at 7AM Take insulin (Humalog (Lyumjev)/Novolog(FiASP)/)Apidra/Admelog) and then eat Give carbohydrate ratio: 1 unit for every 15 grams of carbs (# carbs divided by 15) Give correction if glucose > 120 mg/dL, [Glucose - 120] divided by [75] Lunch: Check Glucose Take insulin (Humalog (Lyumjev)/Novolog(FiASP)/)Apidra/Admelog) and then eat Give carbohydrate ratio: 1 unit for every 15 grams of carbs (# carbs divided by 15) Give correction if glucose > 120 mg/dL (see table) Afternoon: If snack is eaten (optional): 1 unit for every 15 grams of carbs (# carbs divided by 15) Dinner: Check Glucose Take insulin (Humalog (Lyumjev)/Novolog(FiASP)/)Apidra/Admelog) and then eat Give carbohydrate ratio: 1 unit for every 20 grams of carbs (# carbs divided by 20) Give correction if glucose > 120 mg/dL (see table) Bed: Check Glucose (Juice first if BG is less than__70 mg/dL____) Give correction if glucose > 160 mg/dL   -If glucose is 125 mg/dL or more, if snack is desired, then give carb ratio + HALF   correction dose         -If glucose is 125 mg/dL or less, give snack without insulin. NEVER go to bed with a glucose less than 90 mg/dL.  **Remember: Carbohydrate + Correction Dose = units of rapid acting insulin before eating **    Medications:   Continue as currently prescribed  Please allow 3 days for prescription refill requests! After hours are for emergencies only.   Check Blood Glucose:  Before breakfast, before lunch, before dinner, at bedtime, and for symptoms of high or low blood glucose as a minimum.  Check BG 2 hours after meals if adjusting doses.   Check more frequently on days with more activity than  normal.   Check in the middle of the night when evening insulin doses are changed, on days with extra activity in the evening, and if you suspect overnight low glucoses are occurring.   Send a MyChart message as needed for patterns of high or low  glucose levels, or multiple low glucoses.  As a general rule, ALWAYS call us to review your child's blood glucoses IF: Your child has a seizure You have to use glucagon/Baqsimi/Gvoke or glucose gel to bring up the blood sugar  IF you notice a pattern of high blood sugars  If in a week, your child has: 1 blood glucose that is 40 or less  2 blood glucoses that are 50 or less at the same time of day 3 blood glucoses that are 60 or less at the same time of day  Phone: [phone]  Ketones: Check urine or blood ketones if blood glucose is greater than 300 mg/dL (injections) or 449 mg/dL (pump), when ill, or if having symptoms of ketones.  Call if Urine Ketones are moderate or large Call if Blood Ketones are moderate (1-1.5) or large (more than1.5)  Exercise Plan:  Any activity that makes you sweat most days for 60 minutes.   Safety: Wear Medical Alert at [facility] Citizens requesting the [employer] Packages should contact Airline pilot at the [facility] by calling [phone] or e-mail [email or web address].  TEEN REMINDERS:  Check blood glucose before driving If sexually active, use reliable birth control including condoms.  Alcohol in moderation only - check glucoses more frequently, & have a snack with no carb coverage. Glucose gel/cake icing for low glucose. Check glucoses in the middle of the night.  Other: Schedule an eye exam yearly and a dental exam and cleaning every 6 months. Get a flu vaccine yearly, and Covid-19 vaccine unless contraindicated.   SICK DAY GUIDELINES  Remember the following 4 rules: Don't stop taking insulin--doses may need to be adjusted if not eating or blood sugar is low,  but NEVER skip a dose! Correction dose of rapid acting insulin can be given every 3 hours. Check blood sugar levels more frequently--every 2 to 4 hours. Test for urine ketones EVERY time your child urinates. Give/offer lots of fluids/water.  If on a pump: "When in doubt, pull it out." Give insulin injection via insulin pen and needle Check urine for ketones Change pump site Give Ondansetron/Zofran if unable to keep down fluids Recheck glucose in 2-3 hours If not coming down, call the diabetes doctor  WHEN TO CALL YOUR DOCTOR: When an infection is suspected, fever, and for anything not related to diabetes  When to call the diabetes doctor: If your child vomits more than once or refuses food, If urine ketones are moderate or large, If blood sugars are over 200 most of the day or below 80, If steroids have been started for asthma (pediapred, orapred, prednisolone, prednisone).  Diarrhea and vomiting require replacement of fluids and minerals.  If your child is unable to eat solid foods because of nausea, clear liquids should be offered frequently.  It is important to keep your child well hydrated!    For blood sugars less than 150.  FLUIDS:     Foods: -Regular soda   -Regular jello -Gatorade   -Cooked cereal -PowerAde   -Plain yogurt -Juice    -Mashed potatoes -Regular popsicles  - cup ice cream or sherbet -Soup/broth   -toast/saltines    - banana   For blood sugars above 150.  FLUIDS:    Foods:    -Diet soda   -Sugar free jello -Zero Powerade/Gatorade  -Sugar free popsicles   -Water    -Sugar free foods  -Unsweetened tea -Other no-carb fluids

## 2022-04-14 ENCOUNTER — Encounter (INDEPENDENT_AMBULATORY_CARE_PROVIDER_SITE_OTHER): Payer: Self-pay | Admitting: Pediatrics

## 2022-04-15 ENCOUNTER — Telehealth (INDEPENDENT_AMBULATORY_CARE_PROVIDER_SITE_OTHER): Payer: Self-pay

## 2022-04-15 NOTE — Telephone Encounter (Signed)
Initiated prior authorization through [employer]: Key: [facility] [place] Case ID: [id number] 04/15/22 - sent to plan   Transmitter:

## 2022-05-16 NOTE — Progress Notes (Unsigned)
Pediatric Endocrinology Diabetes Consultation Follow-up Visit  Diane [name] 2000-11-22 [id number]  Chief Complaint: Follow-up Type 1 Diabetes    Diane Pilot, MD   HPI: Diane [name]  is a 21 y.o. female presenting for follow-up of Type 1 Diabetes diagnosed 03/11/2020 with new onset diabetes after being seen by PCP one day prior to abdominal pain and sore throat.   In the ED she was found to have a blood glucose of 550 with BHB >8. Her pH was 7.01 with bicarb <7. She was admitted to the PICU for insulin GGT. She was started on Lantus and Novolog MDI and received diabetes education prior to discharge. 03/10/2020- Insulin ab <5, ICA neg, GAD 1,417.2. c.peptide 0.6. Omnipod 5 was  started 02/20/2021. She has contact dermatitis with Omnipod adhesive improved with flonase. She has nocturnal hypoglycemia with menses. she is accompanied to this visit by her  sister .  Since last visit on 04/15/22, she has been well.  No ER visits or hospitalizations. She has been inconsistently wearing Omnipod as she does not have Dexcom supplies.  She will take injections for when she eats at lunch and dinner with hyperglycemia. She is happy first semester of college is almost over.  Insulin regimen: Apps: Bolus calc and MyFitness pal Basal: Lantus 8-10 units Bolus: Novo  CR: 15   ISF: 75  Target: 120 Not using Omnipod.          Hypoglycemia: can feel most low blood sugars.  No glucagon needed recently.  Blood glucose download: Accucheck Guide meter.  Diane [name] Date of birth: [birth year] MRN: [id number] Diane [name] from [facility] BG Log: May 19, 2022  Reporting Period: Nov 21 - May 19, 2022  Avg. Daily Readings In Range (mg/dL) >250     60% (0.4 readings/day) 180-250  20% (0.1 readings/day) 70-180   20% (0.1 readings/day) 54-70    0% (0 readings/day) <54      0% (0 readings/day)  Avg. Glucose (BGM): 304 mg/dL  Std. Deviation (BGM): 128 mg/dL  CV (BGM): 42%  Mon, May 19, 2022 9:09  AM 157 (Meter) Sun, May 18, 2022 10:59 PM 276 (Meter) Diane [name], May 18, 2022 10:23 PM 252 (Meter) Sat, May 17, 2022 10:09 PM 215 (Meter) Sat, May 17, 2022 1:04 PM 149 (Meter) Sat, May 17, 2022 3:54 AM 466 (Meter) Fri, May 16, 2022 2:01 AM 453 ([address] Thu, May 15, 2022 10:12 PM 332 (Meter) Diane [name], May 11, 2022 10:15 PM 244 (Meter) Diane [name], May 11, 2022 3:07 AM 499 (Meter)  Devices Uploaded Roche [id number]   CGM download: Not wearing Dexcom G6 continuous glucose monitor Last worn in September 2023.  Med-alert ID: is currently wearing. Injection/Pump sites: lower extremity Annual labs due 03/2023: October 2023 with LDL 118, rest wnl. Next lipid panel January 2024 Annual Foot Exam: 11/15/21- nl Ophthalmology due: 2023 no retinopathy. Flu vaccine: 2022 COVID vaccine: x2, Covid-19 x1  3. ROS: Greater than 10 systems reviewed with pertinent positives listed in HPI, otherwise neg.  The following portions of the patient's history were reviewed and updated as appropriate:  Past Medical History:  Past Medical History:  Diagnosis Date   Asthma     Medications:  Outpatient Encounter Medications as of 05/19/2022  Medication Sig   Blood Glucose Monitoring Suppl (ACCU-CHEK GUIDE) w/Device KIT 1 each by Does not apply route as directed.   [DISCONTINUED] Accu-Chek FastClix Lancets MISC Check sugar up to 6 times daily. For use with FAST CLIX Lancet Device   [DISCONTINUED]  Insulin Aspart, w/Niacinamide, (FIASP) 100 UNIT/ML SOLN Inject up to 200 units every 2-3 days. Please fill for [employer] [facility].   Accu-Chek FastClix Lancets MISC Check sugar up to 6 times daily. For use with FAST CLIX Lancet Device   acetone, urine, test strip Check ketones per protocol (Patient not taking: Reported on 03/26/2020)   albuterol (PROVENTIL HFA;VENTOLIN HFA) 108 (90 Base) MCG/ACT inhaler Inhale 2 puffs into the lungs every 4 (four) hours as needed for wheezing or shortness of breath. (Patient not taking: Reported on 04/01/2022)   cetirizine  (ZYRTEC) 10 MG tablet Take 10 mg by mouth daily as needed for allergies. (Patient not taking: Reported on 03/26/2020)   Continuous Blood Gluc Receiver (DEXCOM G6 RECEIVER) DEVI 1 Device by Does not apply route as directed. (Patient not taking: Reported on 03/17/2022)   Continuous Blood Gluc Sensor (DEXCOM G6 SENSOR) MISC Inject 1 applicator into the skin as directed. (change sensor every 10 days)   Continuous Blood Gluc Transmit (DEXCOM G6 TRANSMITTER) MISC Inject 1 Device into the skin as directed. (re-use up to 8x with each new sensor)   ferrous sulfate 325 (65 FE) MG tablet Take 1 tablet (325 mg total) by mouth daily. (Patient not taking: Reported on 07/10/2021)   Glucagon (BAQSIMI TWO PACK) 3 MG/DOSE POWD Place 1 each into the nose as needed (severe hypoglycmia with unresponsiveness).   glucose blood (ACCU-CHEK GUIDE) test strip Use as instructed for 6 checks per day plus per protocol for hyper/hypoglycemia   hydrocortisone 1 % ointment Apply 1 application. topically 2 (two) times daily. (Patient not taking: Reported on 11/15/2021)   injection device for insulin (INPEN 100-GREY-NOVO) DEVI Use InPen with insulin cartridges to inject insulin up to 8x per day. (Patient not taking: Reported on 01/07/2021)   insulin aspart (FIASP FLEXTOUCH) 100 UNIT/ML FlexTouch Pen Inject up to 50 units subcutaneously daily as instructed. (Patient not taking: Reported on 04/01/2022)   Insulin Aspart, w/Niacinamide, (FIASP) 100 UNIT/ML SOLN Inject up to 200 units every 2-3 days. Please fill for [employer] [facility].   Insulin Disposable Pump (OMNIPOD 5 G6 POD, GEN 5,) MISC Inject 1 Device into the skin as directed. Change pod every 2 days. Patient will need 3 boxes (each contain 5 pods) for a 30 day supply. Please fill for [facility] [address]21.   insulin glargine (LANTUS SOLOSTAR) 100 UNIT/ML Solostar Pen Up to 50 units per day as directed by MD, in case of pump failure.   Insulin Pen Needle (INSUPEN PEN NEEDLES) 32G X 4 MM MISC BD Pen  Needles- brand specific. Inject insulin via insulin pen 6 x daily   Lancets Misc. (ACCU-CHEK FASTCLIX LANCET) KIT Check sugar 6 times daily (Patient not taking: Reported on 04/23/2020)   ondansetron (ZOFRAN-ODT) 4 MG disintegrating tablet Take 1 tablet (4 mg total) by mouth every 8 (eight) hours as needed for nausea or vomiting. (Patient not taking: Reported on 07/10/2021)   selenium sulfide (SELSUN) 2.5 % shampoo Apply topically. (Patient not taking: Reported on 01/07/2021)   [DISCONTINUED] Continuous Blood Gluc Sensor (DEXCOM G6 SENSOR) MISC Inject 1 applicator into the skin as directed. (change sensor every 10 days) (Patient not taking: Reported on 05/19/2022)   [DISCONTINUED] Continuous Blood Gluc Transmit (DEXCOM G6 TRANSMITTER) MISC Inject 1 Device into the skin as directed. (re-use up to 8x with each new sensor) (Patient not taking: Reported on 05/19/2022)   [DISCONTINUED] Glucagon (BAQSIMI TWO PACK) 3 MG/DOSE POWD Place 1 each into the nose as needed (severe hypoglycmia with unresponsiveness). (Patient not  taking: Reported on 04/23/2020)   [DISCONTINUED] glucose blood (ACCU-CHEK GUIDE) test strip Use as instructed for 6 checks per day plus per protocol for hyper/hypoglycemia (Patient not taking: Reported on 04/01/2022)   [DISCONTINUED] Insulin Disposable Pump (OMNIPOD 5 G6 POD, GEN 5,) MISC Inject 1 Device into the skin as directed. Change pod every 2 days. Patient will need 3 boxes (each contain 5 pods) for a 30 day supply. Please fill for [facility] [address]21. (Patient not taking: Reported on 04/01/2022)   [DISCONTINUED] insulin glargine (LANTUS SOLOSTAR) 100 UNIT/ML Solostar Pen Up to 50 units per day as directed by MD, in case of pump failure. (Patient not taking: Reported on 04/01/2022)   [DISCONTINUED] Insulin Pen Needle (INSUPEN PEN NEEDLES) 32G X 4 MM MISC BD Pen Needles- brand specific. Inject insulin via insulin pen 6 x daily (Patient not taking: Reported on 02/20/2021)   No facility-administered  encounter medications on file as of 05/19/2022.    Allergies: No Known Allergies  Surgical History: Past Surgical History:  Procedure Laterality Date   CYSTECTOMY      Family History:  Family History  Problem Relation Age of Onset   Asthma Sister    Thyroid disease Maternal Aunt    Thyroid disease Maternal Grandmother    Anemia Maternal Grandmother    Asthma Maternal Grandfather    Diabetes Paternal Grandmother     Social History: Social History   Social History Narrative   Lives with parents and 4 siblings   Attends [employer] for physical therapy.     Physical Exam:  Vitals:   05/19/22 1101  BP: 112/70  Pulse: 76  Weight: 117 lb 12.8 oz (53.4 kg)  Height: 5' 8.5" (1.74 m)   BP 112/70   Pulse 76   Ht 5' 8.5" (1.74 m)   Wt 117 lb 12.8 oz (53.4 kg)   BMI 17.65 kg/m  Body mass index: body mass index is 17.65 kg/m. Blood pressure %iles are not available for patients who are 18 years or older.  Ht Readings from Last 3 Encounters:  05/19/22 5' 8.5" (1.74 m) (95 %, Z= 1.66)*  03/17/22 5' 8.47" (1.739 m) (95 %, Z= 1.65)*  11/15/21 5' 8.47" (1.739 m) (95 %, Z= 1.65)*   * Growth percentiles are based on CDC (Girls, 2-20 Years) data.   Wt Readings from Last 3 Encounters:  05/19/22 117 lb 12.8 oz (53.4 kg) (32 %, Z= -0.47)*  04/01/22 109 lb 12.8 oz (49.8 kg) (17 %, Z= -0.97)*  03/17/22 119 lb (54 kg) (35 %, Z= -0.38)*   * Growth percentiles are based on CDC (Girls, 2-20 Years) data.    Physical Exam Vitals reviewed.  Constitutional:      Appearance: Normal appearance. She is not toxic-appearing.  HENT:     Head: Normocephalic and atraumatic.     Nose: Nose normal.     Mouth/Throat:     Mouth: Mucous membranes are moist.  Eyes:     Extraocular Movements: Extraocular movements intact.  Neck:     Comments: No goiter Cardiovascular:     Heart sounds: Normal heart sounds.  Pulmonary:     Effort: Pulmonary effort is normal. No respiratory distress.     Breath  sounds: Normal breath sounds.  Abdominal:     General: There is no distension.  Musculoskeletal:        General: Normal range of motion.     Cervical back: Normal range of motion and neck supple.  Skin:    Capillary  Refill: Capillary refill takes less than 2 seconds.     Findings: No rash.     Comments: No lipohypertrophy.   Neurological:     General: No focal deficit present.     Mental Status: She is alert.     Gait: Gait normal.  Psychiatric:        Mood and Affect: Mood normal.        Behavior: Behavior normal.        Thought Content: Thought content normal.        Judgment: Judgment normal.      Labs: Lab Results  Component Value Date   ISLETAB Negative 03/10/2020  ,  Lab Results  Component Value Date   INSULINAB <5.0 03/10/2020  ,  Lab Results  Component Value Date   GLUTAMICACAB 1,417.2 (H) 03/10/2020  , No results found for: "ZNT8AB" No results found for: "LABIA2"  Last hemoglobin A1c:  Lab Results  Component Value Date   HGBA1C 8.7 (A) 03/17/2022   Results for orders placed or performed in visit on 05/19/22  POCT Glucose (Device for Home Use)  Result Value Ref Range   Glucose Fasting, POC 145 (A) 70 - 99 mg/dL   POC Glucose      Lab Results  Component Value Date   HGBA1C 8.7 (A) 03/17/2022   HGBA1C 5.8 (A) 07/10/2021   HGBA1C 5.8 (A) 04/08/2021    Lab Results  Component Value Date   MICROALBUR 0.2 03/17/2022   LDLCALC 118 (H) 03/17/2022   CREATININE 0.85 04/08/2021    Assessment/Plan: Madalina is a 21 y.o. female with The primary encounter diagnosis was Uncontrolled type 1 diabetes mellitus with hyperglycemia ([place]). Diagnoses of Insulin pump in place, Controlled diabetes mellitus type 1 without complications ([place]), and Uses self-applied continuous glucose monitoring device were also pertinent to this visit.. Diabetes mellitus Type I, under poor control.A1c is above goal of 7% or lower and TIR is below goal of over 70%.  She is no longer running  and less insulin sensitive, so she needs more basal and bolus insulin. Once again PDM is dead and I am concerned about her mental health leading to not prioritizing her health.   When a patient is on insulin, intensive monitoring of blood glucose levels and continuous insulin titration is vital to avoid hyperglycemia and hypoglycemia. Severe hypoglycemia can lead to seizure or death. Hyperglycemia can lead to ketosis requiring ICU admission and intravenous insulin.    New pump settings: Time Basal Rate (U/hr) ISF/CF Carb Ratio Target (mg/dL)  12AM 0.6 50 10 120  9AM  0.65 50 10 120  11PM 0.6 50 10 120  Total basal: 15.1 units/day Reverse correction: off Max basal: 3 units/hr  1. Uncontrolled type 1 diabetes mellitus with hyperglycemia (HCC) -BG elevated -see patient instructions for MDI doses -not wearing Omnipod today and PDM had died, but was able to be charged enough to update pump settings as below -currently taking injections with hyperglycemia -had sample FiASP from last visit, and did not pick up prescriptions from the pharmacy -verifed that Dexcom G6 PA was completed and she can pick up Rx -Rxs sent to pharmacy and sent consult to [facility] to help her with getting medication (asked her to download [facility] app) and adjusted doses (sugar calls) -declined referral to therapy - COLLECTION CAPILLARY BLOOD SPECIMEN - POCT Glucose (Device for Home Use) - insulin glargine (LANTUS SOLOSTAR) 100 UNIT/ML Solostar Pen; Up to 50 units per day as directed by MD, in case  of pump failure.  Dispense: 15 mL; Refill: 3 - Insulin Pen Needle (INSUPEN PEN NEEDLES) 32G X 4 MM MISC; BD Pen Needles- brand specific. Inject insulin via insulin pen 6 x daily  Dispense: 200 each; Refill: 6  2. Insulin pump titration -doses adjusted as above - Insulin Aspart, w/Niacinamide, (FIASP) 100 UNIT/ML SOLN; Inject up to 200 units every 2-3 days. Please fill for [employer].  Dispense: 30 mL; Refill: 6 - Insulin  Disposable Pump (OMNIPOD 5 G6 POD, GEN 5,) MISC; Inject 1 Device into the skin as directed. Change pod every 2 days. Patient will need 3 boxes (each contain 5 pods) for a 30 day supply. Please fill for [facility] [address]21.  Dispense: 15 each; Refill: 4 - Continuous Blood Gluc Transmit (DEXCOM G6 TRANSMITTER) MISC; Inject 1 Device into the skin as directed. (re-use up to 8x with each new sensor)  Dispense: 1 each; Refill: 3 - Continuous Blood Gluc Sensor (DEXCOM G6 SENSOR) MISC; Inject 1 applicator into the skin as directed. (change sensor every 10 days)  Dispense: 3 each; Refill: 11  3. Uses self-applied continuous glucose monitoring device - Insulin Aspart, w/Niacinamide, (FIASP) 100 UNIT/ML SOLN; Inject up to 200 units every 2-3 days. Please fill for [employer].  Dispense: 30 mL; Refill: 6 - Insulin Disposable Pump (OMNIPOD 5 G6 POD, GEN 5,) MISC; Inject 1 Device into the skin as directed. Change pod every 2 days. Patient will need 3 boxes (each contain 5 pods) for a 30 day supply. Please fill for [facility] [address]-21.  Dispense: 15 each; Refill: 4 - Continuous Blood Gluc Transmit (DEXCOM G6 TRANSMITTER) MISC; Inject 1 Device into the skin as directed. (re-use up to 8x with each new sensor)  Dispense: 1 each; Refill: 3 - Continuous Blood Gluc Sensor (DEXCOM G6 SENSOR) MISC; Inject 1 applicator into the skin as directed. (change sensor every 10 days)  Dispense: 3 each; Refill: 11  4. Hyperlipidemia LDL goal <100 -elevated and above goal -working to improve glycemic control -fasting lipid panel January 2024   Orders Placed This Encounter  Procedures   POCT Glucose (Device for Home Use)   COLLECTION CAPILLARY BLOOD SPECIMEN    Meds ordered this encounter  Medications   Insulin Aspart, w/Niacinamide, (FIASP) 100 UNIT/ML SOLN    Sig: Inject up to 200 units every 2-3 days. Please fill for [employer].    Dispense:  30 mL    Refill:  6   Insulin Disposable Pump (OMNIPOD 5 G6 POD, GEN 5,) MISC     Sig: Inject 1 Device into the skin as directed. Change pod every 2 days. Patient will need 3 boxes (each contain 5 pods) for a 30 day supply. Please fill for [facility] [address]21.    Dispense:  15 each    Refill:  4   insulin glargine (LANTUS SOLOSTAR) 100 UNIT/ML Solostar Pen    Sig: Up to 50 units per day as directed by MD, in case of pump failure.    Dispense:  15 mL    Refill:  3    3 ml per pen. 5 pens per box. Please dispense 1 box of pens. For questions regarding this prescription please call [phone].   Insulin Pen Needle (INSUPEN PEN NEEDLES) 32G X 4 MM MISC    Sig: BD Pen Needles- brand specific. Inject insulin via insulin pen 6 x daily    Dispense:  200 each    Refill:  6    For questions regarding this prescription please call (  [phone].   glucose blood (ACCU-CHEK GUIDE) test strip    Sig: Use as instructed for 6 checks per day plus per protocol for hyper/hypoglycemia    Dispense:  200 each    Refill:  3    For use with Accu-Check Guide Meter   Glucagon (BAQSIMI TWO PACK) 3 MG/DOSE POWD    Sig: Place 1 each into the nose as needed (severe hypoglycmia with unresponsiveness).    Dispense:  1 each    Refill:  3   Continuous Blood Gluc Transmit (DEXCOM G6 TRANSMITTER) MISC    Sig: Inject 1 Device into the skin as directed. (re-use up to 8x with each new sensor)    Dispense:  1 each    Refill:  3    1 transmitter = 90 day supply   Continuous Blood Gluc Sensor (DEXCOM G6 SENSOR) MISC    Sig: Inject 1 applicator into the skin as directed. (change sensor every 10 days)    Dispense:  3 each    Refill:  11    1 box = 3 sensors = 30 day supply   Accu-Chek FastClix Lancets MISC    Sig: Check sugar up to 6 times daily. For use with FAST CLIX Lancet Device    Dispense:  204 each    Refill:  3     Patient Instructions  DISCHARGE INSTRUCTIONS FOR Diane [name]  05/19/2022  HbA1c Goals: Our ultimate goal is to achieve the lowest possible HbA1c while avoiding recurrent  severe hypoglycemia.  However all HbA1c goals must be individualized per the American Diabetes Association Clinical Standards.  My Hemoglobin A1c History:  Lab Results  Component Value Date   HGBA1C 8.7 (A) 03/17/2022   HGBA1C 5.8 (A) 07/10/2021   HGBA1C 5.8 (A) 04/08/2021   HGBA1C 9.0 (A) 12/06/2020   HGBA1C 7.7 (A) 09/03/2020   HGBA1C >15.5 (H) 03/10/2020   HGBA1C >15.5 (H) 03/10/2020    My goal HbA1c is: < 7 %  This is equivalent to an average blood glucose of:  HbA1c % = Average BG  6  120   7  150   8  180   9  210   10  240   11  270   12  300   13  330    Insulin:   DIABETES PLAN  Rapid Acting Insulin (Novolog/FiASP (Aspart) and Humalog/Lyumjev (Lispro))  **Given for Food/Carbohydrates and High Sugar/Glucose**   DAYTIME (breakfast, lunch, dinner) Target Blood Glucose 125 mg/dL Insulin Sensitivity Factor 50 Insulin to Carb Ratio  1 unit for 10 grams   Correction DOSE Food DOSE  (Glucose -Target)/Insulin Sensitivity Factor  Glucose (mg/dL) Units of Rapid Acting Insulin  Less than 125 0  126-175 1  176-225 2  226-275 3  276-325 4  326-375 5  376-425 6  426-475 7  476-525 8  526-575 9  576 or more 10   Number of carbohydrates divided by carb ratio  Number of Carbs Units of Rapid Acting Insulin  0-9 0  10-19 1  20-29 2  30-39 3  40-49 4  50-59 5  60-69 6  70-79 7  80-89 8  90-99 9  100-109 10  110-119 11  120-129 12  130-139 13  140-149 14  150-159 15  160+  (# carbs divided by 10)                 **Correction Dose + Food Dose = Number of units of  rapid acting insulin **  Correction for High Sugar/Glucose Food/Carbohydrate  Measure Blood Glucose BEFORE you eat. (Fingerstick with Glucose Meter or check the reading on your Continuous Glucose Meter).  Use the table above or calculate the dose using the formula.  Add this dose to the Food/Carbohydrate dose if eating a meal.  Correction should not be given sooner than every 3 hours since  the last dose of rapid acting insulin. 1. Count the number of carbohydrates you will be eating.  2. Use the table above or calculate the dose using the formula.  3. Add this dose to the Correction dose if glucose is above target.         BEDTIME Target Blood Glucose 200 mg/dL Insulin Sensitivity Factor 50 Insulin to Carb Ratio  1 unit for 10 grams   Wait at least 3 hours after taking dinner dose of insulin BEFORE checking bedtime glucose.   Blood Sugar Less Than  132m/dL? Blood Sugar Between 126 - 1970mdL? Blood Sugar Greater Than 20039mL?  You MUST EAT 10-15 carbs  1. Carb snack not needed  Carb snack not needed    2. Additional, Optional Carb Snack?  If you want more carbs, you CAN eat them now! Make sure to subtract MUST EAT carbs from total carbs then look at chart below to determine food dose. 2. Optional Carb Snack?   You CAN eat this! Make sure to add up total carbs then look at chart below to determine food dose. 2. Optional Carb Snack?   You CAN eat this! Make sure to add up total carbs then look at chart below to determine food dose.  3. Correction Dose of Insulin?  NO  3. Correction Dose of Insulin?  NO 3. Correction Dose of Insulin?  YES; please look at correction dose chart to determine correction dose.   Glucose (mg/dL) Units of Rapid Acting Insulin  Less than 200 0  201-250 1  251-300 2  301-350 3  351-400 4  401-450 5  451-500 6  501-550 7  551 or more 8   Number of Carbs Units of Rapid Acting Insulin  0-9 0  10-19 1  20-29 2  30-39 3  40-49 4  50-59 5  60-69 6  70-79 7  80-89 8  90-99 9  100-109 10  110-119 11  120-129 12  130-139 13  140-149 14  150-159 15  160+  (# carbs divided by 10)          Long Acting Insulin (Glargine (Basaglar/Lantus/Semglee)/Levemir/Tresiba)  **Remember long acting insulin must be given EVERY DAY, and NEVER skip this dose**                                    Give 15 units at 10pm    If you  have any questions/concerns PLEASE call [phone] speak to the on-call  Pediatric Endocrinology provider at [facility].  ColAl CorpusD     Medications: Continue  as currently prescribed  Please allow 3 days for prescription refill requests! After hours are for emergencies only.   Check Blood Glucose:  Before breakfast, before lunch, before dinner, at bedtime, and for symptoms of high or low blood glucose as a minimum.  Check BG 2 hours after meals if adjusting doses.   Check more frequently on days with more activity than normal.   Check in the middle of the  night when evening insulin doses are changed, on days with extra activity in the evening, and if you suspect overnight low glucoses are occurring.   Send a MyChart message as needed for patterns of high or low glucose levels, or multiple low glucoses.  As a general rule, ALWAYS call us to review your child's blood glucoses IF: Your child has a seizure You have to use glucagon/Baqsimi/Gvoke or glucose gel to bring up the blood sugar  IF you notice a pattern of high blood sugars  If in a week, your child has: 1 blood glucose that is 40 or less  2 blood glucoses that are 50 or less at the same time of day 3 blood glucoses that are 60 or less at the same time of day  Phone: [phone]  Ketones: Check urine or blood ketones if blood glucose is greater than 300 mg/dL (injections) or 240 mg/dL (pump), when ill, or if having symptoms of ketones.  Call if Urine Ketones are moderate or large Call if Blood Ketones are moderate (1-1.5) or large (more than1.5)  Exercise Plan:  Any activity that makes you sweat most days for 60 minutes.   Safety: Wear Medical Alert at [place] requesting the Yellow Dot Packages should contact [facility] by calling [phone] or e-mail [email or web address].  TEEN REMINDERS:  Check blood glucose before  driving If sexually active, use reliable birth control including condoms.   Other: Schedule an eye exam yearly and a dental exam and cleaning every 6 months. Get a flu vaccine yearly, and Covid-19 vaccine unless contraindicated.    Follow-up:   Return in about 4 weeks (around 06/16/2022) for POC A1c and follow up.    Medical decision-making:  I spent 30 minutes dedicated to the care of this patient on the date of this encounter to include pre-visit review of laboratory studies, glucose logs/continuous glucose monitor logs, pump downloads, medically appropriate exam, face-to-face time with the patient, ordering of medications, ordering of tests, insulin pump titration, and documenting in the EHR.  Thank you for the opportunity to participate in the care of our mutual patient. Please do not hesitate to contact me should you have any questions regarding the assessment or treatment plan.   Sincerely,   Al Corpus, MD

## 2022-05-19 ENCOUNTER — Telehealth (INDEPENDENT_AMBULATORY_CARE_PROVIDER_SITE_OTHER): Payer: Self-pay

## 2022-05-19 ENCOUNTER — Ambulatory Visit (INDEPENDENT_AMBULATORY_CARE_PROVIDER_SITE_OTHER): Payer: 59 | Admitting: Pediatrics

## 2022-05-19 ENCOUNTER — Encounter (INDEPENDENT_AMBULATORY_CARE_PROVIDER_SITE_OTHER): Payer: Self-pay | Admitting: Pediatrics

## 2022-05-19 VITALS — BP 112/70 | HR 76 | Ht 68.5 in | Wt 117.8 lb

## 2022-05-19 DIAGNOSIS — E785 Hyperlipidemia, unspecified: Secondary | ICD-10-CM

## 2022-05-19 DIAGNOSIS — E1065 Type 1 diabetes mellitus with hyperglycemia: Secondary | ICD-10-CM | POA: Diagnosis not present

## 2022-05-19 DIAGNOSIS — Z4681 Encounter for fitting and adjustment of insulin pump: Secondary | ICD-10-CM | POA: Diagnosis not present

## 2022-05-19 DIAGNOSIS — Z978 Presence of other specified devices: Secondary | ICD-10-CM | POA: Diagnosis not present

## 2022-05-19 LAB — POCT GLUCOSE (DEVICE FOR HOME USE): Glucose Fasting, POC: 145 mg/dL — AB (ref 70–99)

## 2022-05-19 MED ORDER — INSUPEN PEN NEEDLES 32G X 4 MM MISC: 6 refills | Status: AC

## 2022-05-19 MED ORDER — DEXCOM G6 SENSOR MISC: 1.0000 | 11 refills | Status: DC

## 2022-05-19 MED ORDER — OMNIPOD 5 DEXG7G6 PODS GEN 5 MISC: 1.0000 | 4 refills | Status: DC

## 2022-05-19 MED ORDER — DEXCOM G6 TRANSMITTER MISC: 1.0000 | 3 refills | Status: DC

## 2022-05-19 MED ORDER — ACCU-CHEK GUIDE VI STRP: ORAL_STRIP | 3 refills | Status: AC

## 2022-05-19 MED ORDER — LANTUS SOLOSTAR 100 UNIT/ML ~~LOC~~ SOPN: PEN_INJECTOR | SUBCUTANEOUS | 3 refills | Status: DC

## 2022-05-19 MED ORDER — BAQSIMI TWO PACK 3 MG/DOSE NA POWD: 1.0000 | NASAL | 3 refills | Status: AC | PRN

## 2022-05-19 MED ORDER — ACCU-CHEK FASTCLIX LANCETS MISC: 3 refills | Status: AC

## 2022-05-19 MED ORDER — FIASP 100 UNIT/ML IJ SOLN: INTRAMUSCULAR | 6 refills | Status: DC

## 2022-05-19 NOTE — Telephone Encounter (Addendum)
Received fax from pharmacy/covermymeds to complete prior authorization initiated on covermymeds, completed prior authorization   Key: [id number] - PA Case ID: [id number] - Rx #: [id number] 05/19/22 - sent to plan   Pharmacy would like notification of determination [facility] P:[phone] F:  [phone]

## 2022-05-19 NOTE — Patient Instructions (Signed)
DISCHARGE INSTRUCTIONS FOR Diane [name]  05/19/2022  HbA1c Goals: Our ultimate goal is to achieve the lowest possible HbA1c while avoiding recurrent severe hypoglycemia.  However all HbA1c goals must be individualized per the American Diabetes Association Clinical Standards.  My Hemoglobin A1c History:  Lab Results  Component Value Date   HGBA1C 8.7 (A) 03/17/2022   HGBA1C 5.8 (A) 07/10/2021   HGBA1C 5.8 (A) 04/08/2021   HGBA1C 9.0 (A) 12/06/2020   HGBA1C 7.7 (A) 09/03/2020   HGBA1C >15.5 (H) 03/10/2020   HGBA1C >15.5 (H) 03/10/2020    My goal HbA1c is: < 7 %  This is equivalent to an average blood glucose of:  HbA1c % = Average BG  6  120   7  150   8  180   9  210   10  240   11  270   12  300   13  330    Insulin:   DIABETES PLAN  Rapid Acting Insulin (Novolog/FiASP (Aspart) and Humalog/Lyumjev (Lispro))  **Given for Food/Carbohydrates and High Sugar/Glucose**   DAYTIME (breakfast, lunch, dinner) Target Blood Glucose 125 mg/dL Insulin Sensitivity Factor 50 Insulin to Carb Ratio  1 unit for 10 grams   Correction DOSE Food DOSE  (Glucose -Target)/Insulin Sensitivity Factor  Glucose (mg/dL) Units of Rapid Acting Insulin  Less than 125 0  126-175 1  176-225 2  226-275 3  276-325 4  326-375 5  376-425 6  426-475 7  476-525 8  526-575 9  576 or more 10   Number of carbohydrates divided by carb ratio  Number of Carbs Units of Rapid Acting Insulin  0-9 0  10-19 1  20-29 2  30-39 3  40-49 4  50-59 5  60-69 6  70-79 7  80-89 8  90-99 9  100-109 10  110-119 11  120-129 12  130-139 13  140-149 14  150-159 15  160+  (# carbs divided by 10)                 **Correction Dose + Food Dose = Number of units of rapid acting insulin **  Correction for High Sugar/Glucose Food/Carbohydrate  Measure Blood Glucose BEFORE you eat. (Fingerstick with Glucose Meter or check the reading on your Continuous Glucose Meter).  Use the table above or calculate the dose  using the formula.  Add this dose to the Food/Carbohydrate dose if eating a meal.  Correction should not be given sooner than every 3 hours since the last dose of rapid acting insulin. 1. Count the number of carbohydrates you will be eating.  2. Use the table above or calculate the dose using the formula.  3. Add this dose to the Correction dose if glucose is above target.         BEDTIME Target Blood Glucose 200 mg/dL Insulin Sensitivity Factor 50 Insulin to Carb Ratio  1 unit for 10 grams   Wait at least 3 hours after taking dinner dose of insulin BEFORE checking bedtime glucose.   Blood Sugar Less Than  125mg /dL? Blood Sugar Between 126 - 199mg /dL? Blood Sugar Greater Than 200mg /dL?  You MUST EAT 10-15 carbs  1. Carb snack not needed  Carb snack not needed    2. Additional, Optional Carb Snack?  If you want more carbs, you CAN eat them now! Make sure to subtract MUST EAT carbs from total carbs then look at chart below to determine food dose. 2. Optional Carb Snack?  You CAN eat this! Make sure to add up total carbs then look at chart below to determine food dose. 2. Optional Carb Snack?   You CAN eat this! Make sure to add up total carbs then look at chart below to determine food dose.  3. Correction Dose of Insulin?  NO  3. Correction Dose of Insulin?  NO 3. Correction Dose of Insulin?  YES; please look at correction dose chart to determine correction dose.   Glucose (mg/dL) Units of Rapid Acting Insulin  Less than 200 0  201-250 1  251-300 2  301-350 3  351-400 4  401-450 5  451-500 6  501-550 7  551 or more 8   Number of Carbs Units of Rapid Acting Insulin  0-9 0  10-19 1  20-29 2  30-39 3  40-49 4  50-59 5  60-69 6  70-79 7  80-89 8  90-99 9  100-109 10  110-119 11  120-129 12  130-139 13  140-149 14  150-159 15  160+  (# carbs divided by 10)          Long Acting Insulin (Glargine  (Basaglar/Lantus/Semglee)/Levemir/Tresiba)  **Remember long acting insulin must be given EVERY DAY, and NEVER skip this dose**                                    Give 15 units at 10pm    If you have any questions/concerns PLEASE call [phone] to speak to the on-call  Pediatric Endocrinology provider at [facility].  [name], MD     Medications: Continue  as currently prescribed  Please allow 3 days for prescription refill requests! After hours are for emergencies only.   Check Blood Glucose:  Before breakfast, before lunch, before dinner, at bedtime, and for symptoms of high or low blood glucose as a minimum.  Check BG 2 hours after meals if adjusting doses.   Check more frequently on days with more activity than normal.   Check in the middle of the night when evening insulin doses are changed, on days with extra activity in the evening, and if you suspect overnight low glucoses are occurring.   Send a MyChart message as needed for patterns of high or low glucose levels, or multiple low glucoses.  As a general rule, ALWAYS call us to review your child's blood glucoses IF: Your child has a seizure You have to use glucagon/Baqsimi/Gvoke or glucose gel to bring up the blood sugar  IF you notice a pattern of high blood sugars  If in a week, your child has: 1 blood glucose that is 40 or less  2 blood glucoses that are 50 or less at the same time of day 3 blood glucoses that are 60 or less at the same time of day  Phone: [phone]  Ketones: Check urine or blood ketones if blood glucose is greater than 300 mg/dL (injections) or 315 mg/dL (pump), when ill, or if having symptoms of ketones.  Call if Urine Ketones are moderate or large Call if Blood Ketones are moderate (1-1.5) or large (more than1.5)  Exercise Plan:  Any activity that makes you sweat most days for 60 minutes.   Safety: Wear Medical Alert at [facility] Citizens requesting the Yellow  Dot Packages should contact Airline pilot at the [facility] by calling [phone] or e-mail [email or web address].  TEEN REMINDERS:  Check blood glucose before driving If sexually active, use reliable birth control including condoms.   Other: Schedule an eye exam yearly and a dental exam and cleaning every 6 months. Get a flu vaccine yearly, and Covid-19 vaccine unless contraindicated.

## 2022-05-20 ENCOUNTER — Encounter (INDEPENDENT_AMBULATORY_CARE_PROVIDER_SITE_OTHER): Payer: Self-pay | Admitting: Pharmacist

## 2022-06-06 ENCOUNTER — Telehealth (INDEPENDENT_AMBULATORY_CARE_PROVIDER_SITE_OTHER): Payer: Self-pay

## 2022-06-06 DIAGNOSIS — E1065 Type 1 diabetes mellitus with hyperglycemia: Secondary | ICD-10-CM

## 2022-06-06 NOTE — Telephone Encounter (Signed)
Received fax from pharmacy/covermymeds to complete prior authorization initiated on covermymeds, completed prior authorization      Pharmacy would like notification of determination [facility] [phone] F:  [phone]

## 2022-06-18 ENCOUNTER — Encounter (INDEPENDENT_AMBULATORY_CARE_PROVIDER_SITE_OTHER): Payer: Self-pay | Admitting: Pediatrics

## 2022-06-18 MED ORDER — LYUMJEV KWIKPEN 100 UNIT/ML ~~LOC~~ SOPN: PEN_INJECTOR | SUBCUTANEOUS | 5 refills | Status: AC

## 2022-06-18 NOTE — Telephone Encounter (Signed)
Insurance requiring failure of Humalog and Lyumjev prior to approving FiASP. Will prescribe. MyChart Message sent.   Al Corpus, MD. 06/18/2022

## 2022-06-27 ENCOUNTER — Encounter (INDEPENDENT_AMBULATORY_CARE_PROVIDER_SITE_OTHER): Payer: Self-pay

## 2022-06-27 ENCOUNTER — Ambulatory Visit (INDEPENDENT_AMBULATORY_CARE_PROVIDER_SITE_OTHER): Payer: 59 | Admitting: Pediatrics

## 2022-06-27 ENCOUNTER — Encounter (INDEPENDENT_AMBULATORY_CARE_PROVIDER_SITE_OTHER): Payer: Self-pay | Admitting: Pediatrics

## 2022-06-27 VITALS — BP 100/70 | HR 98 | Wt 119.8 lb

## 2022-06-27 DIAGNOSIS — Z7187 Encounter for pediatric-to-adult transition counseling: Secondary | ICD-10-CM | POA: Insufficient documentation

## 2022-06-27 DIAGNOSIS — Z4681 Encounter for fitting and adjustment of insulin pump: Secondary | ICD-10-CM | POA: Diagnosis not present

## 2022-06-27 DIAGNOSIS — Z91199 Patient's noncompliance with other medical treatment and regimen due to unspecified reason: Secondary | ICD-10-CM | POA: Insufficient documentation

## 2022-06-27 DIAGNOSIS — F32A Depression, unspecified: Secondary | ICD-10-CM

## 2022-06-27 DIAGNOSIS — E1065 Type 1 diabetes mellitus with hyperglycemia: Secondary | ICD-10-CM | POA: Diagnosis not present

## 2022-06-27 LAB — POCT GLUCOSE (DEVICE FOR HOME USE): Glucose Fasting, POC: 220 mg/dL — AB (ref 70–99)

## 2022-06-27 LAB — POCT GLYCOSYLATED HEMOGLOBIN (HGB A1C): HbA1c POC (<> result, manual entry): >14 % (ref 4.0–5.6)

## 2022-06-27 NOTE — Patient Instructions (Addendum)
DISCHARGE INSTRUCTIONS FOR Diane [name]  06/27/2022  HbA1c Goals: Our ultimate goal is to achieve the lowest possible HbA1c while avoiding recurrent severe hypoglycemia.  However, all HbA1c goals must be individualized per the American Diabetes Association Clinical Standards.  My Hemoglobin A1c History:  Lab Results  Component Value Date   HGBA1C >14 06/27/2022   HGBA1C 8.7 (A) 03/17/2022   HGBA1C 5.8 (A) 07/10/2021   HGBA1C 5.8 (A) 04/08/2021   HGBA1C 9.0 (A) 12/06/2020   HGBA1C 7.7 (A) 09/03/2020   HGBA1C >15.5 (H) 03/10/2020   HGBA1C >15.5 (H) 03/10/2020    My goal HbA1c is: < 7 %  This is equivalent to an average blood glucose of:   HbA1c % = Average BG  5  97 (78-120)__ 6  126 (100-152)  7  154 (123-185) 8  183 (147-217)  9  212 (170-249)  10  240 (193-282)  11  269 (217-314)  12  298 (240-347)  13  330    Insulin: Reprogrammed pump today Time Basal Rate (U/hr) ISF/CF Carb Ratio Target (mg/dL)  12AM 0.6 50 10 120  9AM  0.65 50 10 120  11PM 0.6 50 10 120   DIABETES PLAN  Rapid Acting Insulin (Novolog/FiASP (Aspart) and Humalog/Lyumjev (Lispro))  **Given for Food/Carbohydrates and High Sugar/Glucose**   DAYTIME (breakfast, lunch, dinner) Target Blood Glucose 125 mg/dL Insulin Sensitivity Factor 50 Insulin to Carb Ratio  1 unit for 10 grams   Correction DOSE Food DOSE  (Glucose -Target)/Insulin Sensitivity Factor  Glucose (mg/dL) Units of Rapid Acting Insulin  Less than 125 0  126-175 1  176-225 2  226-275 3  276-325 4  326-375 5  376-425 6  426-475 7  476-525 8  526-575 9  576 or more 10   Number of carbohydrates divided by carb ratio  Number of Carbs Units of Rapid Acting Insulin  0-9 0  10-19 1  20-29 2  30-39 3  40-49 4  50-59 5  60-69 6  70-79 7  80-89 8  90-99 9  100-109 10  110-119 11  120-129 12  130-139 13  140-149 14  150-159 15  160+  (# carbs divided by 10)                 **Correction Dose + Food Dose = Number of units  of rapid acting insulin **  Correction for High Sugar/Glucose Food/Carbohydrate  Measure Blood Glucose BEFORE you eat. (Fingerstick with Glucose Meter or check the reading on your Continuous Glucose Meter).  Use the table above or calculate the dose using the formula.  Add this dose to the Food/Carbohydrate dose if eating a meal.  Correction should not be given sooner than every 3 hours since the last dose of rapid acting insulin. 1. Count the number of carbohydrates you will be eating.  2. Use the table above or calculate the dose using the formula.  3. Add this dose to the Correction dose if glucose is above target.         BEDTIME Target Blood Glucose 200 mg/dL Insulin Sensitivity Factor 50 Insulin to Carb Ratio  1 unit for 10 grams   Wait at least 3 hours after taking dinner dose of insulin BEFORE checking bedtime glucose.   Blood Sugar Less Than  125mg /dL? Blood Sugar Between 126 - 199mg /dL? Blood Sugar Greater Than 200mg /dL?  You MUST EAT 10-15 carbs  1. Carb snack not needed  Carb snack not needed  2. Additional, Optional Carb Snack?  If you want more carbs, you CAN eat them now! Make sure to subtract MUST EAT carbs from total carbs then look at chart below to determine food dose. 2. Optional Carb Snack?   You CAN eat this! Make sure to add up total carbs then look at chart below to determine food dose. 2. Optional Carb Snack?   You CAN eat this! Make sure to add up total carbs then look at chart below to determine food dose.  3. Correction Dose of Insulin?  NO  3. Correction Dose of Insulin?  NO 3. Correction Dose of Insulin?  YES; please look at correction dose chart to determine correction dose.   Glucose (mg/dL) Units of Rapid Acting Insulin  Less than 200 0  201-250 1  251-300 2  301-350 3  351-400 4  401-450 5  451-500 6  501-550 7  551 or more 8   Number of Carbs Units of Rapid Acting Insulin  0-9 0  10-19 1  20-29 2  30-39 3  40-49 4   50-59 5  60-69 6  70-79 7  80-89 8  90-99 9  100-109 10  110-119 11  120-129 12  130-139 13  140-149 14  150-159 15  160+  (# carbs divided by 10)          Long Acting Insulin (Glargine (Basaglar/Lantus/Semglee)/Levemir/Tresiba)  **Remember long acting insulin must be given EVERY DAY, and NEVER skip this dose**                                    Give 15 units at 10pm    If you have any questions/concerns PLEASE call [phone] to speak to the on-call  Pediatric Endocrinology provider at [facility].  [name], MD   Medications:  Please go to [facility] and see why you are unable to pick up your prescriptions. Continue as currently prescribed  Please allow 3 days for prescription refill requests! After hours are for emergencies only.   Check Blood Glucose:  Before breakfast, before lunch, before dinner, at bedtime, and for symptoms of high or low blood glucose as a minimum.  Check BG 2 hours after meals if adjusting doses.   Check more frequently on days with more activity than normal.   Check in the middle of the night when evening insulin doses are changed, on days with extra activity in the evening, and if you suspect overnight low glucoses are occurring.   Send a MyChart message as needed for patterns of high or low glucose levels, or multiple low glucoses.  As a general rule, ALWAYS call us to review your child's blood glucoses IF: Your child has a seizure You have to use glucagon/Baqsimi/Gvoke or glucose gel to bring up the blood sugar  IF you notice a pattern of high blood sugars  If in a week, your child has: 1 blood glucose that is 40 or less  2 blood glucoses that are 50 or less at the same time of day 3 blood glucoses that are 60 or less at the same time of day  Phone: [phone]  Ketones: Check urine or blood ketones, and if blood glucose is greater than 300 mg/dL (injections) or 440 mg/dL (pump), when ill, or if having  symptoms of ketones.  Call if Urine Ketones are moderate or large Call if Blood Ketones are moderate (1-1.5)  or large (more than1.5)  Exercise Plan:  Any activity that makes you sweat most days for 60 minutes.   Safety: Wear Medical Alert at [place] requesting the Yellow Dot Packages should contact Chiropodist at the [facility] by calling [phone] or e-mail [email or web address].  TEEN REMINDERS:  Check blood glucose before driving If sexually active, use reliable birth control including condoms.  Alcohol in moderation only - check glucoses more frequently, & have a snack with no carb coverage. Glucose gel/cake icing for low glucose. Check glucoses in the middle of the night.  Other: Schedule an eye exam yearly and a dental exam.  Recommend dental cleaning every 6 months. Get a flu vaccine yearly, and Covid-19 vaccine yearly unless contraindicated.

## 2022-06-27 NOTE — Progress Notes (Signed)
Pediatric Endocrinology Diabetes Consultation Follow-up Visit  Diane [name] [birth year] [id number]  Chief Complaint: Follow-up Type 1 Diabetes   Diane [name], Louise, MD   HPI: Diane [name]  is a 22 y.o. female presenting for follow-up of Type 1 Diabetes diagnosed 03/11/2020 with new onset diabetes after being seen by PCP one day prior to abdominal pain and sore throat.   In the ED she was found to have a blood glucose of 550 with BHB >8. Her pH was 7.01 with bicarb <7. She was admitted to the PICU for insulin GGT. She was started on Lantus and Novolog MDI and received diabetes education prior to discharge. 03/10/2020- Insulin ab <5, ICA neg, GAD 1,417.2. c.peptide 0.6. Omnipod 5 was  started 02/20/2021. She has contact dermatitis with Omnipod adhesive improved with flonase. She has nocturnal hypoglycemia with menses. she is accompanied to this visit by her  self  Since last visit on 05/19/22, she has been well.  No ER visits or hospitalizations. Insurance denied FiASP and changed to [employer]. Review of meter downloaded showed mostly hyperglycemia with multiple days of no glucose checks. Randomly glucose down to 113 mg/dL. No glucose checks from Christmas until New Years. Her old pump stopped working and updated the new one. She did not check EMR for settings, nor call the office for help. She reported using bolus calc and adjusting. However, she believes average glucose in 300s.  She agreed that she could be depressed, though she feels "fine."  Insulin regimen: Apps: Bolus calc and MyFitness pal Back up injection doses Basal: Lantus 15 units Bolus: Novo  CR: 10   ISF: 50  Target: 125             Hypoglycemia: can feel most low blood sugars.  No glucagon needed recently.  Blood glucose download: Accucheck Guide meter.  Diane [name] Date of birth: Mar 31, 2001 MRN: [id number] Diane [name] Exported from [place] BG Log: Jun 27, 2022  Reporting Period: Jun 14, 2022 - Jun 27, 2022  Avg. Daily  Readings In Range (mg/dL) >562>250     13%70% (1 readings/day) 180-250  15% (0.2 readings/day) 70-180   15% (0.2 readings/day) 54-70    0% (0 readings/day) <54      0% (0 readings/day)  Avg. Glucose (BGM): 358 mg/dL  Std. Deviation (BGM): 167 mg/dL  CV (BGM): 08%46%  Fri, Jun 27, 2022 11:44 AM 271 (Meter) Diane EssexFri, Jun 27, 2022 1:00 AM 276 ([address]) Thu, Jun 26, 2022 9:04 PM 239 ([address]eter) Thu, Jun 26, 2022 12:05 AM 261 ([address]Meter) Wed, Jun 25, 2022 10:16 PM 572 (Meter) Tue, Jun 24, 2022 10:52 PM 151 (Meter) Tue, Jun 24, 2022 6:09 PM 113 (Meter) Tue, Jun 24, 2022 2:19 PM 601 (Meter) Mon, Jun 23, 2022 7:44 PM 337 (Meter) Sun, Jun 22, 2022 10:16 PM 211 (Meter) Sun, Jun 22, 2022 6:54 PM 270 (Meter) Sun, Jun 22, 2022 5:54 AM 200 (Meter) Sun, Jun 22, 2022 12:49 AM 442 (Meter) Sat, Jun 21, 2022 10:47 PM 531 (Meter) Fri, Jun 20, 2022 5:57 PM 549 ([address]Meter) Wed, Jun 18, 2022 5:26 PM 167 ([address]) Wed, Jun 18, 2022 4:29 AM 601 ([address]) Tue, Jun 17, 2022 12:03 AM 322 (Meter) Mon, Jun 16, 2022 4:49 AM 538 (Meter) Diane LinkSun, Jun 15, 2022 2:31 AM 514 (Meter)  Devices Uploaded Roche 923    CGM download: Not wearing Dexcom G6 continuous glucose monitor Last worn in September 2023.   Med-alert ID: is currently wearing. Injection/Pump sites: lower extremity Annual labs due 03/2023: October  2023 with LDL 118, rest wnl. Next lipid panel January 2024 Annual Foot Exam: 11/15/21- nl Ophthalmology due: 2023 no retinopathy. Flu vaccine: 2022 COVID vaccine: x2, Covid-19 x1  ROS: Greater than 10 systems reviewed with pertinent positives listed in HPI, otherwise neg.  The following portions of the patient's history were reviewed and updated as appropriate:  Past Medical History:  Past Medical History:  Diagnosis Date   Asthma     Medications:  Outpatient Encounter Medications as of 06/27/2022  Medication Sig   Accu-Chek FastClix Lancets MISC Check sugar up to 6 times daily. For use with FAST CLIX Lancet Device   acetone, urine, test  strip Check ketones per protocol   albuterol (PROVENTIL HFA;VENTOLIN HFA) 108 (90 Base) MCG/ACT inhaler Inhale 2 puffs into the lungs every 4 (four) hours as needed for wheezing or shortness of breath.   Blood Glucose Monitoring Suppl (ACCU-CHEK GUIDE) w/Device KIT 1 each by Does not apply route as directed.   cetirizine (ZYRTEC) 10 MG tablet Take 10 mg by mouth daily as needed for allergies.   Continuous Blood Gluc Receiver (DEXCOM G6 RECEIVER) DEVI 1 Device by Does not apply route as directed.   Continuous Blood Gluc Sensor (DEXCOM G6 SENSOR) MISC Inject 1 applicator into the skin as directed. (change sensor every 10 days)   Continuous Blood Gluc Transmit (DEXCOM G6 TRANSMITTER) MISC Inject 1 Device into the skin as directed. (re-use up to 8x with each new sensor)   Glucagon (BAQSIMI TWO PACK) 3 MG/DOSE POWD Place 1 each into the nose as needed (severe hypoglycmia with unresponsiveness).   glucose blood (ACCU-CHEK GUIDE) test strip Use as instructed for 6 checks per day plus per protocol for hyper/hypoglycemia   hydrocortisone 1 % ointment Apply 1 application. topically 2 (two) times daily.   injection device for insulin (INPEN 100-GREY-NOVO) DEVI Use InPen with insulin cartridges to inject insulin up to 8x per day.   insulin aspart (FIASP FLEXTOUCH) 100 UNIT/ML FlexTouch Pen Inject up to 50 units subcutaneously daily as instructed.   Insulin Aspart, w/Niacinamide, (FIASP) 100 UNIT/ML SOLN Inject up to 200 units every 2-3 days. Please fill for [employer].   Insulin Disposable Pump (OMNIPOD 5 G6 POD, GEN 5,) MISC Inject 1 Device into the skin as directed. Change pod every 2 days. Patient will need 3 boxes (each contain 5 pods) for a 30 day supply. Please fill for [facility] [id number].   insulin glargine (LANTUS SOLOSTAR) 100 UNIT/ML Solostar Pen Up to 50 units per day as directed by MD, in case of pump failure.   Insulin Lispro-aabc (LYUMJEV KWIKPEN) 100 UNIT/ML KwikPen Inject up to 50 units  subcutaneously daily as instructed.   Insulin Pen Needle (INSUPEN PEN NEEDLES) 32G X 4 MM MISC BD Pen Needles- brand specific. Inject insulin via insulin pen 6 x daily   Lancets Misc. (ACCU-CHEK FASTCLIX LANCET) KIT Check sugar 6 times daily   ondansetron (ZOFRAN-ODT) 4 MG disintegrating tablet Take 1 tablet (4 mg total) by mouth every 8 (eight) hours as needed for nausea or vomiting.   selenium sulfide (SELSUN) 2.5 % shampoo Apply topically.   ferrous sulfate 325 (65 FE) MG tablet Take 1 tablet (325 mg total) by mouth daily. (Patient not taking: Reported on 07/10/2021)   No facility-administered encounter medications on file as of 06/27/2022.    Allergies: No Known Allergies  Surgical History: Past Surgical History:  Procedure Laterality Date   CYSTECTOMY      Family History:  Family History  Problem  Relation Age of Onset   Asthma Sister    Thyroid disease Maternal Aunt    Thyroid disease Maternal Grandmother    Anemia Maternal Grandmother    Asthma Maternal Grandfather    Diabetes Paternal Grandmother     Social History: Social History   Social History Narrative   Lives with parents and 4 siblings   Attends [employer] for physical therapy.     Physical Exam:  Vitals:   06/27/22 1213  BP: 100/70  Pulse: 98  Weight: 119 lb 12.8 oz (54.3 kg)   BP 100/70   Pulse 98   Wt 119 lb 12.8 oz (54.3 kg)   BMI 17.95 kg/m  Body mass index: body mass index is 17.95 kg/m. Blood pressure %iles are not available for patients who are 18 years or older.  Ht Readings from Last 3 Encounters:  05/19/22 5' 8.5" (1.74 m) (95 %, Z= 1.66)*  03/17/22 5' 8.47" (1.739 m) (95 %, Z= 1.65)*  11/15/21 5' 8.47" (1.739 m) (95 %, Z= 1.65)*   * Growth percentiles are based on CDC (Girls, 2-20 Years) data.   Wt Readings from Last 3 Encounters:  06/27/22 119 lb 12.8 oz (54.3 kg) (36 %, Z= -0.37)*  05/19/22 117 lb 12.8 oz (53.4 kg) (32 %, Z= -0.47)*  04/01/22 109 lb 12.8 oz (49.8 kg) (17 %, Z=  -0.97)*   * Growth percentiles are based on CDC (Girls, 2-20 Years) data.    Physical Exam Vitals reviewed.  Constitutional:      Appearance: Normal appearance. She is not toxic-appearing.  HENT:     Head: Normocephalic and atraumatic.     Nose: Nose normal.     Mouth/Throat:     Mouth: Mucous membranes are moist.  Eyes:     Extraocular Movements: Extraocular movements intact.  Neck:     Comments: No goiter Cardiovascular:     Heart sounds: Normal heart sounds.  Pulmonary:     Effort: Pulmonary effort is normal. No respiratory distress.     Breath sounds: Normal breath sounds.  Abdominal:     General: There is no distension.  Musculoskeletal:        General: Normal range of motion.     Cervical back: Normal range of motion and neck supple.  Skin:    Capillary Refill: Capillary refill takes less than 2 seconds.     Findings: No rash.     Comments: No lipohypertrophy.   Neurological:     General: No focal deficit present.     Mental Status: She is alert.     Gait: Gait normal.  Psychiatric:        Mood and Affect: Mood normal.        Behavior: Behavior normal.        Thought Content: Thought content normal.        Judgment: Judgment normal.      Labs: Lab Results  Component Value Date   ISLETAB Negative 03/10/2020  ,  Lab Results  Component Value Date   INSULINAB <5.0 03/10/2020  ,  Lab Results  Component Value Date   GLUTAMICACAB 1,417.2 (H) 03/10/2020  , No results found for: "ZNT8AB" No results found for: "LABIA2"  Last hemoglobin A1c:  Lab Results  Component Value Date   HGBA1C >14 06/27/2022   Results for orders placed or performed in visit on 06/27/22  POCT Glucose (Device for Home Use)  Result Value Ref Range   Glucose Fasting, POC 220 (A) 70 -  99 mg/dL   POC Glucose    POCT glycosylated hemoglobin (Hb A1C)  Result Value Ref Range   Hemoglobin A1C (S)    HbA1c POC (<> result, manual entry) >14 4.0 - 5.6 %   HbA1c, POC (prediabetic range)      HbA1c, POC (controlled diabetic range)      Lab Results  Component Value Date   HGBA1C >14 06/27/2022   HGBA1C 8.7 (A) 03/17/2022   HGBA1C 5.8 (A) 07/10/2021    Lab Results  Component Value Date   MICROALBUR 0.2 03/17/2022   LDLCALC 118 (H) 03/17/2022   CREATININE 0.85 04/08/2021    Assessment/Plan: Terrisa is a 22 y.o. female with The primary encounter diagnosis was Uncontrolled type 1 diabetes mellitus with hyperglycemia (HCC). Diagnoses of Insulin pump titration, Counseling for transition from pediatric to adult care provider, Noncompliance with diabetes treatment, and Depression, unspecified depression type were also pertinent to this visit.. Diabetes mellitus Type I, under poor control.A1c is above goal of 7% or lower and risen dramatically. It is the highest is has been since diagnosis. She is clearly depressed and finally agreed to speak with someone. Referral sent for therapist and transition to adult endo. She reported trouble with pharmacy, and she will go to local pharmacy to find out if there is a problem and let us know. Settings on PDM reset. LDL not at goal of <100 with worsening glycemic control. Will order fasting lipid panel at next visit as we work towards improving glycemic control.  She agreed to contact me to adjust settings in between visits.  When a patient is on insulin, intensive monitoring of blood glucose levels and continuous insulin titration is vital to avoid hyperglycemia and hypoglycemia. Severe hypoglycemia can lead to seizure or death. Hyperglycemia can lead to ketosis requiring ICU admission and intravenous insulin.    Restarted pump settings: Time Basal Rate (U/hr) ISF/CF Carb Ratio Target (mg/dL)  56DJ 0.6 50 10 497  9AM  0.65 50 10 120  11PM 0.6 50 10 120  Total basal: 15.1 units/day Reverse correction: off Max basal: 1.3 units/hr Max bolus: 15 units Active insulin time 3 hours   Orders Placed This Encounter  Procedures   Ambulatory  referral to Endocrinology   Amb ref to Integrated Behavioral Health   POCT Glucose (Device for Home Use)   POCT glycosylated hemoglobin (Hb A1C)   COLLECTION CAPILLARY BLOOD SPECIMEN    No orders of the defined types were placed in this encounter.    Patient Instructions  DISCHARGE INSTRUCTIONS FOR OLENE GODFREY  06/27/2022  HbA1c Goals: Our ultimate goal is to achieve the lowest possible HbA1c while avoiding recurrent severe hypoglycemia.  However, all HbA1c goals must be individualized per the American Diabetes Association Clinical Standards.  My Hemoglobin A1c History:  Lab Results  Component Value Date   HGBA1C >14 06/27/2022   HGBA1C 8.7 (A) 03/17/2022   HGBA1C 5.8 (A) 07/10/2021   HGBA1C 5.8 (A) 04/08/2021   HGBA1C 9.0 (A) 12/06/2020   HGBA1C 7.7 (A) 09/03/2020   HGBA1C >15.5 (H) 03/10/2020   HGBA1C >15.5 (H) 03/10/2020    My goal HbA1c is: < 7 %  This is equivalent to an average blood glucose of:   HbA1c % = Average BG  5  97 (78-120)__ 6  126 (100-152)  7  154 (123-185) 8  183 (147-217)  9  212 (170-249)  10  240 (193-282)  11  269 (217-314)  12  298 (240-347)  13  330    Insulin: Reprogrammed pump today Time Basal Rate (U/hr) ISF/CF Carb Ratio Target (mg/dL)  84ON 0.6 50 10 629  9AM  0.65 50 10 120  11PM 0.6 50 10 120   DIABETES PLAN  Rapid Acting Insulin (Novolog/FiASP (Aspart) and Humalog/Lyumjev (Lispro))  **Given for Food/Carbohydrates and High Sugar/Glucose**   DAYTIME (breakfast, lunch, dinner) Target Blood Glucose 125 mg/dL Insulin Sensitivity Factor 50 Insulin to Carb Ratio  1 unit for 10 grams   Correction DOSE Food DOSE  (Glucose -Target)/Insulin Sensitivity Factor  Glucose (mg/dL) Units of Rapid Acting Insulin  Less than 125 0  126-175 1  176-225 2  226-275 3  276-325 4  326-375 5  376-425 6  426-475 7  476-525 8  526-575 9  576 or more 10   Number of carbohydrates divided by carb ratio  Number of Carbs Units of Rapid Acting  Insulin  0-9 0  10-19 1  20-29 2  30-39 3  40-49 4  50-59 5  60-69 6  70-79 7  80-89 8  90-99 9  100-109 10  110-119 11  120-129 12  130-139 13  140-149 14  150-159 15  160+  (# carbs divided by 10)                 **Correction Dose + Food Dose = Number of units of rapid acting insulin **  Correction for High Sugar/Glucose Food/Carbohydrate  Measure Blood Glucose BEFORE you eat. (Fingerstick with Glucose Meter or check the reading on your Continuous Glucose Meter).  Use the table above or calculate the dose using the formula.  Add this dose to the Food/Carbohydrate dose if eating a meal.  Correction should not be given sooner than every 3 hours since the last dose of rapid acting insulin. 1. Count the number of carbohydrates you will be eating.  2. Use the table above or calculate the dose using the formula.  3. Add this dose to the Correction dose if glucose is above target.         BEDTIME Target Blood Glucose 200 mg/dL Insulin Sensitivity Factor 50 Insulin to Carb Ratio  1 unit for 10 grams   Wait at least 3 hours after taking dinner dose of insulin BEFORE checking bedtime glucose.   Blood Sugar Less Than  125mg /dL? Blood Sugar Between 126 - 199mg /dL? Blood Sugar Greater Than 200mg /dL?  You MUST EAT 10-15 carbs  1. Carb snack not needed  Carb snack not needed    2. Additional, Optional Carb Snack?  If you want more carbs, you CAN eat them now! Make sure to subtract MUST EAT carbs from total carbs then look at chart below to determine food dose. 2. Optional Carb Snack?   You CAN eat this! Make sure to add up total carbs then look at chart below to determine food dose. 2. Optional Carb Snack?   You CAN eat this! Make sure to add up total carbs then look at chart below to determine food dose.  3. Correction Dose of Insulin?  NO  3. Correction Dose of Insulin?  NO 3. Correction Dose of Insulin?  YES; please look at correction dose chart to determine  correction dose.   Glucose (mg/dL) Units of Rapid Acting Insulin  Less than 200 0  201-250 1  251-300 2  301-350 3  351-400 4  401-450 5  451-500 6  501-550 7  551 or more 8   Number of Carbs Units of  Rapid Acting Insulin  0-9 0  10-19 1  20-29 2  30-39 3  40-49 4  50-59 5  60-69 6  70-79 7  80-89 8  90-99 9  100-109 10  110-119 11  120-129 12  130-139 13  140-149 14  150-159 15  160+  (# carbs divided by 10)          Long Acting Insulin (Glargine (Basaglar/Lantus/Semglee)/Levemir/Tresiba)  **Remember long acting insulin must be given EVERY DAY, and NEVER skip this dose**                                    Give 15 units at 10pm    If you have any questions/concerns PLEASE call [phone] to speak to the on-call  Pediatric Endocrinology provider at [facility].  Al Corpus, MD   Medications:  Please go to [facility] and see why you are unable to pick up your prescriptions. Continue as currently prescribed  Please allow 3 days for prescription refill requests! After hours are for emergencies only.   Check Blood Glucose:  Before breakfast, before lunch, before dinner, at bedtime, and for symptoms of high or low blood glucose as a minimum.  Check BG 2 hours after meals if adjusting doses.   Check more frequently on days with more activity than normal.   Check in the middle of the night when evening insulin doses are changed, on days with extra activity in the evening, and if you suspect overnight low glucoses are occurring.   Send a MyChart message as needed for patterns of high or low glucose levels, or multiple low glucoses.  As a general rule, ALWAYS call us to review your child's blood glucoses IF: Your child has a seizure You have to use glucagon/Baqsimi/Gvoke or glucose gel to bring up the blood sugar  IF you notice a pattern of high blood sugars  If in a week, your child has: 1 blood glucose that is 40 or less  2 blood glucoses  that are 50 or less at the same time of day 3 blood glucoses that are 60 or less at the same time of day  Phone: [phone]  Ketones: Check urine or blood ketones, and if blood glucose is greater than 300 mg/dL (injections) or 240 mg/dL (pump), when ill, or if having symptoms of ketones.  Call if Urine Ketones are moderate or large Call if Blood Ketones are moderate (1-1.5) or large (more than1.5)  Exercise Plan:  Any activity that makes you sweat most days for 60 minutes.   Safety: Wear Medical Alert at [place] requesting the [employer] should contact Chirop[facility]ist at the [facility] by calling [phone] or e-mail [email or web address].  TEEN REMINDERS:  Check blood glucose before driving If sexually active, use reliable birth control including condoms.  Alcohol in moderation only - check glucoses more frequently, & have a snack with no carb coverage. Glucose gel/cake icing for low glucose. Check glucoses in the middle of the night.  Other: Schedule an eye exam yearly and a dental exam.  Recommend dental cleaning every 6 months. Get a flu vaccine yearly, and Covid-19 vaccine yearly unless contraindicated.     Follow-up:   Return in about 4 weeks (around 07/25/2022) for follow up.   Orders Placed This Encounter  Procedures   Ambulatory referral to Endocrinology   Amb ref to Integrated Behavioral  Health   POCT Glucose (Device for Home Use)   POCT glycosylated hemoglobin (Hb A1C)   COLLECTION CAPILLARY BLOOD SPECIMEN    No orders of the defined types were placed in this encounter.  Medical decision-making:  I have personally spent 45 minutes involved in face-to-face and non-face-to-face activities for this patient on the day of the visit. Professional time spent includes the following activities, in addition to those noted in the documentation: preparation time/chart review, ordering of medications/tests/procedures,  obtaining and/or reviewing separately obtained history, counseling and educating the patient/family/caregiver, performing a medically appropriate examination and/or evaluation, referring and communicating with other health care professionals for care coordination,  review and interpretation of glucose logs,  interpretation of pump downloads, and documentation in the EHR.   Thank you for the opportunity to participate in the care of our mutual patient. Please do not hesitate to contact me should you have any questions regarding the assessment or treatment plan.   Sincerely,   [name], MD

## 2022-07-10 ENCOUNTER — Encounter (INDEPENDENT_AMBULATORY_CARE_PROVIDER_SITE_OTHER): Payer: Self-pay | Admitting: Pediatrics

## 2022-07-11 ENCOUNTER — Telehealth (INDEPENDENT_AMBULATORY_CARE_PROVIDER_SITE_OTHER): Payer: Self-pay

## 2022-07-11 NOTE — Telephone Encounter (Signed)
Attempted PA - no insurance information available, will follow up

## 2022-07-11 NOTE — Telephone Encounter (Signed)
Patient stopped by, meter uploaded.  Dr. Leana Roe saw 1 low.  She asked that I tell patient to continue monitoring.  When I spoke with patient she stated "so check my finger when I am low"  I stated yes.  She stated I usually don't, I just get up and treat.  I told her to be sure to check her blood sugar every time she feels low prior to treatment so Dr. Leana Roe can see the data on her glucometer.  I also recommended that she document what she ate last and when prior to the low.

## 2022-07-14 NOTE — Telephone Encounter (Signed)
Attempted PA with current insurance information that populated by [facility]

## 2022-07-16 ENCOUNTER — Encounter (INDEPENDENT_AMBULATORY_CARE_PROVIDER_SITE_OTHER): Payer: Self-pay

## 2022-08-08 ENCOUNTER — Encounter (INDEPENDENT_AMBULATORY_CARE_PROVIDER_SITE_OTHER): Payer: Self-pay | Admitting: Pediatrics

## 2022-08-08 ENCOUNTER — Ambulatory Visit (INDEPENDENT_AMBULATORY_CARE_PROVIDER_SITE_OTHER): Payer: 59 | Admitting: Pediatrics

## 2022-08-08 VITALS — BP 112/76 | HR 76 | Wt 127.4 lb

## 2022-08-08 DIAGNOSIS — E1065 Type 1 diabetes mellitus with hyperglycemia: Secondary | ICD-10-CM

## 2022-08-08 DIAGNOSIS — E10649 Type 1 diabetes mellitus with hypoglycemia without coma: Secondary | ICD-10-CM | POA: Diagnosis not present

## 2022-08-08 DIAGNOSIS — F32A Depression, unspecified: Secondary | ICD-10-CM

## 2022-08-08 DIAGNOSIS — Z4681 Encounter for fitting and adjustment of insulin pump: Secondary | ICD-10-CM

## 2022-08-08 DIAGNOSIS — Z978 Presence of other specified devices: Secondary | ICD-10-CM

## 2022-08-08 DIAGNOSIS — Z7187 Encounter for pediatric-to-adult transition counseling: Secondary | ICD-10-CM

## 2022-08-08 LAB — POCT GLUCOSE (DEVICE FOR HOME USE): POC Glucose: 81 mg/dl (ref 70–99)

## 2022-08-08 MED ORDER — DEXCOM G6 SENSOR MISC: 1.0000 | 11 refills | Status: DC

## 2022-08-08 MED ORDER — DEXCOM G6 TRANSMITTER MISC: 1.0000 | 3 refills | Status: DC

## 2022-08-08 NOTE — Patient Instructions (Addendum)
DISCHARGE INSTRUCTIONS FOR Diane [name]  08/08/2022  HbA1c Goals: Our ultimate goal is to achieve the lowest possible HbA1c while avoiding recurrent severe hypoglycemia.  However, all HbA1c goals must be individualized per the American Diabetes Association Clinical Standards.  My Hemoglobin A1c History:  Lab Results  Component Value Date   HGBA1C >14 06/27/2022   HGBA1C 8.7 (A) 03/17/2022   HGBA1C 5.8 (A) 07/10/2021   HGBA1C 5.8 (A) 04/08/2021   HGBA1C 9.0 (A) 12/06/2020   HGBA1C 7.7 (A) 09/03/2020   HGBA1C >15.5 (H) 03/10/2020   HGBA1C >15.5 (H) 03/10/2020    My goal HbA1c is: < 7 %  This is equivalent to an average blood glucose of:   HbA1c % = Average BG  5  97 (78-120)__ 6  126 (100-152)  7  154 (123-185) 8  183 (147-217)  9  212 (170-249)  10  240 (193-282)  11  269 (217-314)  12  298 ([id number])  13  330    Adult Endocrinology: [facility]: [phone], you left a voicemail to get an appointment. You need to call back if you do not hear from them in 1 week.   Insulin: For your pump, we decreased that basal rate at 4PM to 0.6. This has decreased your total basal to 14.75 units per day. Let me know if the evening lows (less than 70 mg/dL) continue.   DIABETES PLAN  Rapid Acting Insulin (Novolog/FiASP (Aspart) and Humalog/Lyumjev (Lispro))  **Given for Food/Carbohydrates and High Sugar/Glucose**   DAYTIME (breakfast, lunch, dinner) Target Blood Glucose 125 mg/dL Insulin Sensitivity Factor 50 Insulin to Carb Ratio  1 unit for 10 grams   Correction DOSE Food DOSE  (Glucose -Target)/Insulin Sensitivity Factor  Glucose (mg/dL) Units of Rapid Acting Insulin  Less than 125 0  126-175 1  176-225 2  226-275 3  276-325 4  326-375 5  376-425 6  426-475 7  476-525 8  526-575 9  576 or more 10   Number of carbohydrates divided by carb ratio  Number of Carbs Units of Rapid Acting Insulin  0-9 0  10-19 1  20-29 2  30-39 3  40-49 4  50-59 5  60-69 6   70-79 7  80-89 8  90-99 9  100-109 10  110-119 11  120-129 12  130-139 13  140-149 14  150-159 15  160+  (# carbs divided by 10)                 **Correction Dose + Food Dose = Number of units of rapid acting insulin **  Correction for High Sugar/Glucose Food/Carbohydrate  Measure Blood Glucose BEFORE you eat. (Fingerstick with Glucose Meter or check the reading on your Continuous Glucose Meter).  Use the table above or calculate the dose using the formula.  Add this dose to the Food/Carbohydrate dose if eating a meal.  Correction should not be given sooner than every 3 hours since the last dose of rapid acting insulin. 1. Count the number of carbohydrates you will be eating.  2. Use the table above or calculate the dose using the formula.  3. Add this dose to the Correction dose if glucose is above target.         BEDTIME Target Blood Glucose 200 mg/dL Insulin Sensitivity Factor 50 Insulin to Carb Ratio  1 unit for 10 grams   Wait at least 3 hours after taking dinner dose of insulin BEFORE checking bedtime glucose.   Blood Sugar Less  Than  '125mg'$ /dL? Blood Sugar Between 126 - '199mg'$ /dL? Blood Sugar Greater Than '200mg'$ /dL?  You MUST EAT 10-15 carbs  1. Carb snack not needed  Carb snack not needed    2. Additional, Optional Carb Snack?  If you want more carbs, you CAN eat them now! Make sure to subtract MUST EAT carbs from total carbs then look at chart below to determine food dose. 2. Optional Carb Snack?   You CAN eat this! Make sure to add up total carbs then look at chart below to determine food dose. 2. Optional Carb Snack?   You CAN eat this! Make sure to add up total carbs then look at chart below to determine food dose.  3. Correction Dose of Insulin?  NO  3. Correction Dose of Insulin?  NO 3. Correction Dose of Insulin?  YES; please look at correction dose chart to determine correction dose.   Glucose (mg/dL) Units of Rapid Acting Insulin  Less than  200 0  201-250 1  251-300 2  301-350 3  351-400 4  401-450 5  451-500 6  501-550 7  551 or more 8   Number of Carbs Units of Rapid Acting Insulin  0-9 0  10-19 1  20-29 2  30-39 3  40-49 4  50-59 5  60-69 6  70-79 7  80-89 8  90-99 9  100-109 10  110-119 11  120-129 12  130-139 13  140-149 14  150-159 15  160+  (# carbs divided by 10)          Long Acting Insulin (Glargine (Basaglar/Lantus/Semglee)/Levemir/Tresiba)  **Remember long acting insulin must be given EVERY DAY, and NEVER skip this dose**                                    Give 15 units at 10pm    If you have any questions/concerns PLEASE call [phone] to speak to the on-call  Pediatric Endocrinology provider at [facility].  Al Corpus, MD   Medications:  Continue as currently prescribed  Please allow 3 days for prescription refill requests! After hours are for emergencies only.   Check Blood Glucose:  Before breakfast, before lunch, before dinner, at bedtime, and for symptoms of high or low blood glucose as a minimum.  Check BG 2 hours after meals if adjusting doses.   Check more frequently on days with more activity than normal.   Check in the middle of the night when evening insulin doses are changed, on days with extra activity in the evening, and if you suspect overnight low glucoses are occurring.   Send a MyChart message as needed for patterns of high or low glucose levels, or multiple low glucoses.  As a general rule, ALWAYS call us to review your child's blood glucoses IF: Your child has a seizure You have to use glucagon/Baqsimi/Gvoke or glucose gel to bring up the blood sugar  IF you notice a pattern of high blood sugars  If in a week, your child has: 1 blood glucose that is 40 or less  2 blood glucoses that are 50 or less at the same time of day 3 blood glucoses that are 60 or less at the same time of day  Phone: [phone]  Ketones: Check urine or  blood ketones, and if blood glucose is greater than 300 mg/dL (injections) or 240 mg/dL (pump), when ill, or if having  symptoms of ketones.  Call if Urine Ketones are moderate or large Call if Blood Ketones are moderate (1-1.5) or large (more than1.5)  Exercise Plan:  Any activity that makes you sweat most days for 60 minutes.   Safety: Wear Medical Alert at [place] requesting the [employer] Dot Packages should contact [facility] at the [facility] by calling [phone] or e-mail [email or web address].  TEEN REMINDERS:  Check blood glucose before driving If sexually active, use reliable birth control including condoms.  Alcohol in moderation only - check glucoses more frequently, & have a snack with no carb coverage. Glucose gel/cake icing for low glucose. Check glucoses in the middle of the night.  Other: Schedule an eye exam yearly and a dental exam.  Recommend dental cleaning every 6 months. Get a flu vaccine yearly, and Covid-19 vaccine yearly unless contraindicated. Rotate injections sites and avoid any hard lumps (lipohypertrophy)

## 2022-08-08 NOTE — Telephone Encounter (Signed)
Sensor:      Transmitter:

## 2022-08-08 NOTE — Progress Notes (Signed)
Pediatric Endocrinology Diabetes Consultation Follow-up Visit  Diane [name] [birth year] [id number]  Chief Complaint: Follow-up Type 1 Diabetes   [name], MD   HPI: Diane [name]  is a 22 y.o. female presenting for follow-up of Type 1 Diabetes diagnosed 03/11/2020 with new onset diabetes after being seen by PCP one day prior to abdominal pain and sore throat.   In the ED she was found to have a blood glucose of 550 with BHB >8. Her pH was 7.01 with bicarb <7. She was admitted to the PICU for insulin GGT. She was started on Lantus and Novolog MDI and received diabetes education prior to discharge. 03/10/2020- Insulin ab <5, ICA neg, GAD 1,417.2. c.peptide 0.6. Omnipod 5 was  started 02/20/2021. She has contact dermatitis with Omnipod adhesive improved with flonase. She has nocturnal hypoglycemia with menses. she is accompanied to this visit by her  self  Since last visit on 06/27/22, she has been well and went back to wearing her pump after our last visit. It is in manual mode since she does not have Dexcom G6 sensors.  No ER visits or hospitalizations. She has not scheduled appt with [facility] yet. She would like to speak with therapist.  Insulin regimen: Apps: Bolus calc and MyFitness pal Back up injection doses Basal: Lantus 15 units Bolus: Novo  CR: 10   ISF: 50  Target: 125         Hypoglycemia: can feel most low blood sugars.  No glucagon needed recently.  Blood glucose download: Accucheck Guide meter.  Diane [name] Date of birth: [birth year] MRN: [id number] Diane [name] from [place] BG Log: Aug 08, 2022  Reporting Period: Feb 9 - Aug 07, 2022  Avg. Daily Readings In Range (mg/dL) >250     9% (0.1 readings/day) 180-250  9% (0.1 readings/day) 70-180   68% (1.1 readings/day) 54-70    14% (0.2 readings/day) <54      0% (0 readings/day)  Avg. Glucose (BGM): 128 mg/dL  Std. Deviation (BGM): 69 mg/dL  CV (BGM): 54%  Thu, Aug 07, 2022 10:14 PM 79 (Meter) Wed,  Aug 06, 2022 5:41 PM 76 (Meter) Tue, Aug 05, 2022 7:04 PM 117 (Meter) Tue, Aug 05, 2022 12:26 PM 154 (Meter) [place], Aug 04, 2022 10:23 PM 259 (Meter) [place], Aug 04, 2022 5:56 PM 206 (Meter) Nancy Fetter, Aug 03, 2022 11:49 PM 340 (Meter) Nancy Fetter, Aug 03, 2022 5:22 PM 107 (Meter) Sat, Aug 02, 2022 9:57 PM 73 (Meter) Sat, Aug 02, 2022 4:52 PM 104 (Meter) Diane [name], Aug 01, 2022 8:10 PM 68 (Meter) Fri, Aug 01, 2022 2:21 PM 115 (Meter) Thu, Jul 31, 2022 11:25 PM 81 (Meter) Thu, Jul 31, 2022 6:18 PM 108 (Meter) Wed, Jul 30, 2022 9:31 PM 60 ([address] Wed, Jul 30, 2022 7:51 PM 152 (Meter) Wed, Jul 30, 2022 5:54 PM 85 (Meter) Tue, Jul 29, 2022 8:09 PM 193 (Meter) Tue, Jul 29, 2022 4:14 PM 68 (Meter) Mon, Jul 28, 2022 2:06 AM 116 (Meter) Sat, Jul 26, 2022 10:16 PM 137 (Meter) Fri, Jul 25, 2022 10:34 PM 124 (Meter)  Devices Uploaded Roche 92  CGM download: Not wearing Dexcom G6 continuous glucose monitor Last worn in September 2023. Was awaiting ins approval, approved today, RX sent.   Med-alert ID: is currently wearing. Injection/Pump sites: lower extremity Annual labs due 03/2023: October 2023 with LDL 118, rest wnl. Next lipid panel January 2024 Annual Foot Exam: 11/15/21- nl Ophthalmology due: 2023 no retinopathy. Flu vaccine: 2022 COVID vaccine: x2,  Covid-19 x1  ROS: Greater than 10 systems reviewed with pertinent positives listed in HPI, otherwise neg.  The following portions of the patient's history were reviewed and updated as appropriate:  Past Medical History:  Past Medical History:  Diagnosis Date   Asthma     Medications:  Outpatient Encounter Medications as of 08/08/2022  Medication Sig   Accu-Chek FastClix Lancets MISC Check sugar up to 6 times daily. For use with FAST CLIX Lancet Device   acetone, urine, test strip Check ketones per protocol   albuterol (PROVENTIL HFA;VENTOLIN HFA) 108 (90 Base) MCG/ACT inhaler Inhale 2 puffs into the lungs every 4 (four) hours as needed for wheezing or  shortness of breath.   Blood Glucose Monitoring Suppl (ACCU-CHEK GUIDE) w/Device KIT 1 each by Does not apply route as directed.   glucose blood (ACCU-CHEK GUIDE) test strip Use as instructed for 6 checks per day plus per protocol for hyper/hypoglycemia   Insulin Disposable Pump (OMNIPOD 5 G6 POD, GEN 5,) MISC Inject 1 Device into the skin as directed. Change pod every 2 days. Patient will need 3 boxes (each contain 5 pods) for a 30 day supply. Please fill for [facility] [phone].   insulin glargine (LANTUS SOLOSTAR) 100 UNIT/ML Solostar Pen Up to 50 units per day as directed by MD, in case of pump failure.   Insulin Pen Needle (INSUPEN PEN NEEDLES) 32G X 4 MM MISC BD Pen Needles- brand specific. Inject insulin via insulin pen 6 x daily   Lancets Misc. (ACCU-CHEK FASTCLIX LANCET) KIT Check sugar 6 times daily   cetirizine (ZYRTEC) 10 MG tablet Take 10 mg by mouth daily as needed for allergies. (Patient not taking: Reported on 08/08/2022)   Continuous Blood Gluc Receiver (DEXCOM G6 RECEIVER) DEVI 1 Device by Does not apply route as directed. (Patient not taking: Reported on 08/08/2022)   Continuous Blood Gluc Sensor (DEXCOM G6 SENSOR) MISC Inject 1 applicator into the skin as directed. (change sensor every 10 days)   Continuous Blood Gluc Transmit (DEXCOM G6 TRANSMITTER) MISC Inject 1 Device into the skin as directed. (re-use up to 8x with each new sensor)   ferrous sulfate 325 (65 FE) MG tablet Take 1 tablet (325 mg total) by mouth daily. (Patient not taking: Reported on 07/10/2021)   Glucagon (BAQSIMI TWO PACK) 3 MG/DOSE POWD Place 1 each into the nose as needed (severe hypoglycmia with unresponsiveness). (Patient not taking: Reported on 08/08/2022)   hydrocortisone 1 % ointment Apply 1 application. topically 2 (two) times daily. (Patient not taking: Reported on 08/08/2022)   injection device for insulin (INPEN 100-GREY-NOVO) DEVI Use InPen with insulin cartridges to inject insulin up to 8x per day. (Patient  not taking: Reported on 08/08/2022)   insulin aspart (FIASP FLEXTOUCH) 100 UNIT/ML FlexTouch Pen Inject up to 50 units subcutaneously daily as instructed. (Patient not taking: Reported on 08/08/2022)   Insulin Aspart, w/Niacinamide, (FIASP) 100 UNIT/ML SOLN Inject up to 200 units every 2-3 days. Please fill for [facility]. (Patient not taking: Reported on 08/08/2022)   Insulin Lispro-aabc (LYUMJEV KWIKPEN) 100 UNIT/ML KwikPen Inject up to 50 units subcutaneously daily as instructed. (Patient not taking: Reported on 08/08/2022)   ondansetron (ZOFRAN-ODT) 4 MG disintegrating tablet Take 1 tablet (4 mg total) by mouth every 8 (eight) hours as needed for nausea or vomiting. (Patient not taking: Reported on 08/08/2022)   selenium sulfide (SELSUN) 2.5 % shampoo Apply topically. (Patient not taking: Reported on 08/08/2022)   [DISCONTINUED] Continuous Blood Gluc Sensor (DEXCOM G6 SENSOR)  MISC Inject 1 applicator into the skin as directed. (change sensor every 10 days) (Patient not taking: Reported on 08/08/2022)   [DISCONTINUED] Continuous Blood Gluc Transmit (DEXCOM G6 TRANSMITTER) MISC Inject 1 Device into the skin as directed. (re-use up to 8x with each new sensor) (Patient not taking: Reported on 08/08/2022)   No facility-administered encounter medications on file as of 08/08/2022.    Allergies: No Known Allergies  Surgical History: Past Surgical History:  Procedure Laterality Date   CYSTECTOMY      Family History:  Family History  Problem Relation Age of Onset   Asthma Sister    Thyroid disease Maternal Aunt    Thyroid disease Maternal Grandmother    Anemia Maternal Grandmother    Asthma Maternal Grandfather    Diabetes Paternal Grandmother     Social History: Social History   Social History Narrative   Lives with parents and 4 siblings   Attends [employer] for physical therapy.     Physical Exam:  Vitals:   08/08/22 1008  BP: 112/76  Pulse: 76  Weight: 127 lb 6.4 oz (57.8 kg)   BP  112/76   Pulse 76   Wt 127 lb 6.4 oz (57.8 kg)   LMP 07/23/2022   BMI 19.09 kg/m  Body mass index: body mass index is 19.09 kg/m. Blood pressure %iles are not available for patients who are 18 years or older.  Ht Readings from Last 3 Encounters:  05/19/22 5' 8.5" (1.74 m) (95 %, Z= 1.66)*  03/17/22 5' 8.47" (1.739 m) (95 %, Z= 1.65)*  11/15/21 5' 8.47" (1.739 m) (95 %, Z= 1.65)*   * Growth percentiles are based on CDC (Girls, 2-20 Years) data.   Wt Readings from Last 3 Encounters:  08/08/22 127 lb 6.4 oz (57.8 kg) (50 %, Z= 0.01)*  06/27/22 119 lb 12.8 oz (54.3 kg) (36 %, Z= -0.37)*  05/19/22 117 lb 12.8 oz (53.4 kg) (32 %, Z= -0.47)*   * Growth percentiles are based on CDC (Girls, 2-20 Years) data.    Physical Exam Vitals reviewed.  Constitutional:      Appearance: Normal appearance. She is not toxic-appearing.  HENT:     Head: Normocephalic and atraumatic.     Nose: Nose normal.     Mouth/Throat:     Mouth: Mucous membranes are moist.  Eyes:     Extraocular Movements: Extraocular movements intact.  Cardiovascular:     Heart sounds: Normal heart sounds.  Pulmonary:     Effort: Pulmonary effort is normal. No respiratory distress.     Breath sounds: Normal breath sounds.  Abdominal:     General: There is no distension.  Musculoskeletal:        General: Normal range of motion.     Cervical back: Normal range of motion and neck supple.  Skin:    Capillary Refill: Capillary refill takes less than 2 seconds.     Findings: No rash.     Comments: No lipohypertrophy.   Neurological:     General: No focal deficit present.     Mental Status: She is alert.     Gait: Gait normal.  Psychiatric:        Mood and Affect: Mood normal.        Behavior: Behavior normal.        Thought Content: Thought content normal.        Judgment: Judgment normal.      Labs: Lab Results  Component Value Date   ISLETAB  Negative 03/10/2020  ,  Lab Results  Component Value Date    INSULINAB <5.0 03/10/2020  ,  Lab Results  Component Value Date   GLUTAMICACAB 1,417.2 (H) 03/10/2020  , No results found for: "ZNT8AB" No results found for: "LABIA2"  Last hemoglobin A1c:  Lab Results  Component Value Date   HGBA1C >14 06/27/2022   Results for orders placed or performed in visit on 08/08/22  POCT Glucose (Device for Home Use)  Result Value Ref Range   Glucose Fasting, POC     POC Glucose 81 70 - 99 mg/dl    Lab Results  Component Value Date   HGBA1C >14 06/27/2022   HGBA1C 8.7 (A) 03/17/2022   HGBA1C 5.8 (A) 07/10/2021    Lab Results  Component Value Date   MICROALBUR 0.2 03/17/2022   LDLCALC 118 (H) 03/17/2022   CREATININE 0.85 04/08/2021    Assessment/Plan: Liddie is a 22 y.o. female with The primary encounter diagnosis was Uncontrolled type 1 diabetes mellitus with hyperglycemia ([place]). Diagnoses of Hypoglycemia due to type 1 diabetes mellitus ([place]), Insulin pump titration, Uses self-applied continuous glucose monitoring device, Counseling for transition from pediatric to adult care provider, and Depression, unspecified depression type were also pertinent to this visit.. Diabetes mellitus Type I, under poor control. A1c is above goal of 7% or lower, but recently has been more compliant with wearing pump. She has had hypoglycemia after 4PM with or without bolusing, so have adjusted basal rate. She is less depressed and will refer to our therapist. We are transitioning to adult endo, and she left voicemail to call her back to schedule. LDL not at goal of <100 with worsening glycemic control. Will order fasting lipid panel at next visit as we work towards improving glycemic control.  When a patient is on insulin, intensive monitoring of blood glucose levels and continuous insulin titration is vital to avoid hyperglycemia and hypoglycemia. Severe hypoglycemia can lead to seizure or death. Hyperglycemia can lead to ketosis requiring ICU admission and intravenous  insulin.    Restarted pump settings: Time Basal Rate (U/hr) ISF/CF Carb Ratio Target (mg/dL)  12AM 0.6 50 10 120  9AM  0.65 50 10 120  4PM 0.6 50 10 120  Total basal: 14.75 units/day Reverse correction: off Max basal: 1.3 units/hr Max bolus: 15 units Active insulin time 3 hours   Orders Placed This Encounter  Procedures   Amb ref to [facility]   POCT Glucose (Device for Home Use)   COLLECTION CAPILLARY BLOOD SPECIMEN    Meds ordered this encounter  Medications   Continuous Blood Gluc Sensor (DEXCOM G6 SENSOR) MISC    Sig: Inject 1 applicator into the skin as directed. (change sensor every 10 days)    Dispense:  3 each    Refill:  11    1 box = 3 sensors = 30 day supply   Continuous Blood Gluc Transmit (DEXCOM G6 TRANSMITTER) MISC    Sig: Inject 1 Device into the skin as directed. (re-use up to 8x with each new sensor)    Dispense:  1 each    Refill:  3    1 transmitter = 90 day supply     Patient Instructions  DISCHARGE INSTRUCTIONS FOR Diane [name]  08/08/2022  HbA1c Goals: Our ultimate goal is to achieve the lowest possible HbA1c while avoiding recurrent severe hypoglycemia.  However, all HbA1c goals must be individualized per the American Diabetes Association Clinical Standards.  My Hemoglobin A1c History:  Lab Results  Component Value Date   HGBA1C >14 06/27/2022   HGBA1C 8.7 (A) 03/17/2022   HGBA1C 5.8 (A) 07/10/2021   HGBA1C 5.8 (A) 04/08/2021   HGBA1C 9.0 (A) 12/06/2020   HGBA1C 7.7 (A) 09/03/2020   HGBA1C >15.5 (H) 03/10/2020   HGBA1C >15.5 (H) 03/10/2020    My goal HbA1c is: < 7 %  This is equivalent to an average blood glucose of:   HbA1c % = Average BG  5  97 (78-120)__ 6  126 (100-152)  7  154 (123-185) 8  183 (147-217)  9  212 (170-249)  10  240 (193-282)  11  269 (217-314)  12  298 ([id number])  13  330    Adult Endocrinology: [facility]: [phone], you left a voicemail to get an appointment. You need to call  back if you do not hear from them in 1 week.   Insulin: For your pump, we decreased that basal rate at 4PM to 0.6. This has decreased your total basal to 14.75 units per day. Let me know if the evening lows (less than 70 mg/dL) continue.   DIABETES PLAN  Rapid Acting Insulin (Novolog/FiASP (Aspart) and Humalog/Lyumjev (Lispro))  **Given for Food/Carbohydrates and High Sugar/Glucose**   DAYTIME (breakfast, lunch, dinner) Target Blood Glucose 125 mg/dL Insulin Sensitivity Factor 50 Insulin to Carb Ratio  1 unit for 10 grams   Correction DOSE Food DOSE  (Glucose -Target)/Insulin Sensitivity Factor  Glucose (mg/dL) Units of Rapid Acting Insulin  Less than 125 0  126-175 1  176-225 2  226-275 3  276-325 4  326-375 5  376-425 6  426-475 7  476-525 8  526-575 9  576 or more 10   Number of carbohydrates divided by carb ratio  Number of Carbs Units of Rapid Acting Insulin  0-9 0  10-19 1  20-29 2  30-39 3  40-49 4  50-59 5  60-69 6  70-79 7  80-89 8  90-99 9  100-109 10  110-119 11  120-129 12  130-139 13  140-149 14  150-159 15  160+  (# carbs divided by 10)                 **Correction Dose + Food Dose = Number of units of rapid acting insulin **  Correction for High Sugar/Glucose Food/Carbohydrate  Measure Blood Glucose BEFORE you eat. (Fingerstick with Glucose Meter or check the reading on your Continuous Glucose Meter).  Use the table above or calculate the dose using the formula.  Add this dose to the Food/Carbohydrate dose if eating a meal.  Correction should not be given sooner than every 3 hours since the last dose of rapid acting insulin. 1. Count the number of carbohydrates you will be eating.  2. Use the table above or calculate the dose using the formula.  3. Add this dose to the Correction dose if glucose is above target.         BEDTIME Target Blood Glucose 200 mg/dL Insulin Sensitivity Factor 50 Insulin to Carb Ratio  1 unit for 10 grams    Wait at least 3 hours after taking dinner dose of insulin BEFORE checking bedtime glucose.   Blood Sugar Less Than  '125mg'$ /dL? Blood Sugar Between 126 - '199mg'$ /dL? Blood Sugar Greater Than '200mg'$ /dL?  You MUST EAT 10-15 carbs  1. Carb snack not needed  Carb snack not needed    2. Additional, Optional Carb Snack?  If you want more carbs, you  CAN eat them now! Make sure to subtract MUST EAT carbs from total carbs then look at chart below to determine food dose. 2. Optional Carb Snack?   You CAN eat this! Make sure to add up total carbs then look at chart below to determine food dose. 2. Optional Carb Snack?   You CAN eat this! Make sure to add up total carbs then look at chart below to determine food dose.  3. Correction Dose of Insulin?  NO  3. Correction Dose of Insulin?  NO 3. Correction Dose of Insulin?  YES; please look at correction dose chart to determine correction dose.   Glucose (mg/dL) Units of Rapid Acting Insulin  Less than 200 0  201-250 1  251-300 2  301-350 3  351-400 4  401-450 5  451-500 6  501-550 7  551 or more 8   Number of Carbs Units of Rapid Acting Insulin  0-9 0  10-19 1  20-29 2  30-39 3  40-49 4  50-59 5  60-69 6  70-79 7  80-89 8  90-99 9  100-109 10  110-119 11  120-129 12  130-139 13  140-149 14  150-159 15  160+  (# carbs divided by 10)          Long Acting Insulin (Glargine (Basaglar/Lantus/Semglee)/Levemir/Tresiba)  **Remember long acting insulin must be given EVERY DAY, and NEVER skip this dose**                                    Give 15 units at 10pm    If you have any questions/concerns PLEASE call [phone] to speak to the on-call  Pediatric Endocrinology provider at [facility].  Al Corpus, MD   Medications:  Continue as currently prescribed  Please allow 3 days for prescription refill requests! After hours are for emergencies only.   Check Blood Glucose:  Before breakfast,  before lunch, before dinner, at bedtime, and for symptoms of high or low blood glucose as a minimum.  Check BG 2 hours after meals if adjusting doses.   Check more frequently on days with more activity than normal.   Check in the middle of the night when evening insulin doses are changed, on days with extra activity in the evening, and if you suspect overnight low glucoses are occurring.   Send a MyChart message as needed for patterns of high or low glucose levels, or multiple low glucoses.  As a general rule, ALWAYS call us to review your child's blood glucoses IF: Your child has a seizure You have to use glucagon/Baqsimi/Gvoke or glucose gel to bring up the blood sugar  IF you notice a pattern of high blood sugars  If in a week, your child has: 1 blood glucose that is 40 or less  2 blood glucoses that are 50 or less at the same time of day 3 blood glucoses that are 60 or less at the same time of day  Phone: [phone]  Ketones: Check urine or blood ketones, and if blood glucose is greater than 300 mg/dL (injections) or 240 mg/dL (pump), when ill, or if having symptoms of ketones.  Call if Urine Ketones are moderate or large Call if Blood Ketones are moderate (1-1.5) or large (more than1.5)  Exercise Plan:  Any activity that makes you sweat most days for 60 minutes.   Safety: Wear Medical Alert at [place]  Citizens requesting the Yellow Dot Packages should contact [facility] at the [facility] by calling [phone] or e-mail [email or web address].  TEEN REMINDERS:  Check blood glucose before driving If sexually active, use reliable birth control including condoms.  Alcohol in moderation only - check glucoses more frequently, & have a snack with no carb coverage. Glucose gel/cake icing for low glucose. Check glucoses in the middle of the night.  Other: Schedule an eye exam yearly and a dental exam.  Recommend dental cleaning every 6  months. Get a flu vaccine yearly, and Covid-19 vaccine yearly unless contraindicated. Rotate injections sites and avoid any hard lumps (lipohypertrophy)     Follow-up:   Return in about 4 weeks (around 09/05/2022) for follow up and pump titration.   Orders Placed This Encounter  Procedures   Amb ref to [place]   POCT Glucose (Device for Home Use)   COLLECTION CAPILLARY BLOOD SPECIMEN    Meds ordered this encounter  Medications   Continuous Blood Gluc Sensor (DEXCOM G6 SENSOR) MISC    Sig: Inject 1 applicator into the skin as directed. (change sensor every 10 days)    Dispense:  3 each    Refill:  11    1 box = 3 sensors = 30 day supply   Continuous Blood Gluc Transmit (DEXCOM G6 TRANSMITTER) MISC    Sig: Inject 1 Device into the skin as directed. (re-use up to 8x with each new sensor)    Dispense:  1 each    Refill:  3    1 transmitter = 90 day supply   Medical decision-making:  I have personally spent 45 minutes involved in face-to-face and non-face-to-face activities for this patient on the day of the visit. Professional time spent includes the following activities, in addition to those noted in the documentation: preparation time/chart review, ordering of medications/tests/procedures, obtaining and/or reviewing separately obtained history, counseling and educating the patient/family/caregiver, performing a medically appropriate examination and/or evaluation, referring and communicating with other health care professionals for care coordination,  review and interpretation of glucose logs,  interpretation of pump downloads, and documentation in the EHR.   Thank you for the opportunity to participate in the care of our mutual patient. Please do not hesitate to contact me should you have any questions regarding the assessment or treatment plan.   Sincerely,   Al Corpus, MD

## 2022-08-13 NOTE — BH Specialist Note (Deleted)
Integrated Behavioral Health Initial In-Person Visit  MRN: [id number] Name: Diane [name]  Number of [place] Clinician visits: Initial Visit Session Start time:  Session End time:  Total time in minutes:   Types of Service: {CHL AMB TYPE OF SERVICE:[phone]}  Interpretor:{yes [id number]    Subjective: Diane [name] is a 22 y.o. female accompanied by {CHL AMB ACCOMPANIED BC:[id number]  Patient was referred by Dr. Leana Roe for transition from pediatric to adult endocrinology and depression.  Patient reports the following symptoms/concerns: ***  Duration of problem: ***; Severity of problem: {Mild/Moderate/Severe:20260}  Objective: Mood: {BHH MOOD:22306} and Affect: {BHH AFFECT:22307} Risk of harm to self or others: {CHL AMB BH Suicide Current Mental Status:21022748}  Life Context: Family and Social: *** School/Work: *** Self-Care: *** Life Changes: ***  Patient and/or Family's Strengths/Protective Factors: {CHL AMB BH PROTECTIVE [phone]}  Goals Addressed: Patient will: Reduce symptoms of: {IBH Symptoms:21014056} Increase knowledge and/or ability of: {IBH Patient Tools:21014057}  Demonstrate ability to: {IBH Goals:21014053}  Progress towards Goals: {CHL AMB BH PROGRESS TOWARDS GOALS:[phone]}  Interventions: Interventions utilized: {IBH Interventions:21014054}  Standardized Assessments completed: {IBH Screening Tools:21014051}  Patient and/or Family Response: ***  Patient Centered Plan: Patient is on the following Treatment Plan(s):  ***  Assessment: Patient currently experiencing ***.   Patient may benefit from ***.  Plan: Follow up with behavioral health clinician on : *** Behavioral recommendations: *** Referral(s): {IBH Referrals:21014055} "From scale of 1-10, how likely are you to follow plan?": ***  Valda Favia, LCSW

## 2022-08-15 ENCOUNTER — Institutional Professional Consult (permissible substitution) (INDEPENDENT_AMBULATORY_CARE_PROVIDER_SITE_OTHER): Payer: 59 | Admitting: Licensed Clinical Social Worker

## 2022-08-18 ENCOUNTER — Ambulatory Visit (INDEPENDENT_AMBULATORY_CARE_PROVIDER_SITE_OTHER): Payer: 59 | Admitting: Licensed Clinical Social Worker

## 2022-08-18 DIAGNOSIS — F4322 Adjustment disorder with anxiety: Secondary | ICD-10-CM | POA: Diagnosis not present

## 2022-08-18 NOTE — BH Specialist Note (Unsigned)
Integrated Behavioral Health Initial In-Person Visit  MRN: [id number] Name: Diane [name]  Number of [facility] Clinician visits: Initial Visit Session Start time: 2:30 Session End time: 3:10 Total time in minutes: 40  Types of Service: Individual psychotherapy  Interpretor:No.   Subjective: Diane [name] is a 22 y.o. female accompanied by Sibling  Patient was referred by Dr. Leana Roe for depression and transition to adult care provider for Endocrinology.  Patient reports the following symptoms/concerns:  -Patient reports she feels like she has been taking better care of herself in the past month, reporting her sugars are good. -Patient reports her insurance stopped paying for her Dexcom about a year ago so she has to transition to managing her diabetes manually. Patient reports at times this has been mildy stressful and at times she did not want to do it. -Patient reports she does not feel like she is depressed when clinician was explaining Dr. Rockwell Alexandria reason for referral. Patient agreed with the transition to adult endocrinology as a transition and upcoming change.  Duration of problem: about a year; Severity of problem: mild  Objective: Mood: Euthymic and Affect: Appropriate Risk of harm to self or others: No plan to harm self or others  Life Context:  Family and Social: Patient lives with her mother, father, older and younger sisters. Patient reports she has "no social life" as she "does not like people," further reporting she has a difficult time interacting with peers.  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 Self-Care: Patient reports listening to music helps her feel calm. Life Changes: Patient reports finding out about her diabetes diagnosis at 28 was a shock and transition. Patient reports insurance not approving her Dexcom was a significant change as she now has to manage  her diabetes manually.  Patient and/or Family's Strengths/Protective Factors: Concrete supports in place (healthy food, safe environments, etc.) and familial support and involvement, and bond with sisters.  Goals Addressed: Patient will: Reduce symptoms of: anxiety and mild stress associated with Dexcom not getting approved through insurance. Patient has been managing her diabetes manually.  Increase knowledge and/or ability of: coping skills and stress reduction  Demonstrate ability to: Increase healthy adjustment to current life circumstances and improve healthy communication skills and explore coping and relaxation skills.     Progress towards Goals: Ongoing  Interventions: Interventions utilized: Motivational Interviewing, Supportive Counseling, Psychoeducation and/or Health Education, and Supportive Reflection  Standardized Assessments completed: PHQ-SADS     08/18/2022    2:48 PM  PHQ-SADS Last 3 Score only  PHQ-15 Score 5  Total GAD-7 Score 10  PHQ Adolescent Score 5    Patient and/or Family Response: Patient receptive to clinician assessment and intervention. Patient reports she has a difficult time in social settings communicating and interacting with people, reporting she "doesn't like people." When discussing goals patient reports she would like to work on skills to communicate better with people, socially. Patient reports it has been a mildly stressful transition at different points throughout the past year without her Dexcom. Patient reports her parents are saving and working on getting her one.   Patient Centered Plan:  Patient is on the following Treatment Plan(s):  Patient will find ways to cope with stressors, especially associated with manually managing diabetes to prevent diabetes distress since transition. Patient will learn skills to communicate socially with peers and learn coping and relaxation skills to help with anxiety.  Assessment: Patient currently  experiencing mild stress related to managing diabetes and anxiety.   Patient may benefit from continued IBH services to help process anxiety and stress associated with diabetes management as well as social anxiety .  Plan: Follow up with behavioral health clinician on : in two weeks Behavioral recommendations: See goals addressed and treatment plan. Referral(s): [facility] (In Clinic) "From scale of 1-10, how likely are you to follow plan?": likely  Valda Favia, LCSW

## 2022-09-01 ENCOUNTER — Ambulatory Visit (INDEPENDENT_AMBULATORY_CARE_PROVIDER_SITE_OTHER): Payer: 59 | Admitting: Licensed Clinical Social Worker

## 2022-09-01 DIAGNOSIS — F4322 Adjustment disorder with anxiety: Secondary | ICD-10-CM

## 2022-09-01 NOTE — BH Specialist Note (Unsigned)
Integrated Behavioral Health Follow Up In-Person Visit  MRN: [id number] Name: Diane [name]  Number of [facility] Clinician visits: Second Visit Session Start time: 3:31 pm Session End time: 4:18 pm Total time in minutes: 47 min.  Types of Service: Individual psychotherapy  Interpretor:No.   Subjective: Diane [name] is a 22 y.o. female accompanied by  Self  Patient was referred by Dr. Leana Roe for depression and transition to adult care provider for Endocrinology.   Subjective:  -Patient reports she had an appointment at the adult dentist today for the first time, reporting it went really well and it was a pleasant experience. -Patient reports interactions with people she does not know (calling doctors offices, asking for something she needs in the grocery store, etc) can at times be difficult because she does not know what to say, reporting the negative thoughts "what if I say the wrong thing" "what if I say something stupid?" -Patient reports she used to be better with interacting with people prior to her experiences during the 2020 pandemic/shutdown.  -Patient reports believing the transition to adult endocrinology will be in June.   Duration of initial  problem: about a year; Severity of problem: mild  Objective: Mood: Euthymic and Affect: Appropriate Risk of harm to self or others: No plan to harm self or others  Patient and/or Family's Strengths/Protective Factors: Concrete supports in place (healthy food, safe environments, etc.) and familial support and involvement , and bond with sisters  Goals Addressed: Patient will:  Reduce symptoms of: anxiety, including social anxiety, and mild stress associated with Dexcom not getting approved. Patient have been managing her diabetes manually   Increase knowledge and/or ability of: coping skills and stress reduction   Demonstrate ability to: Increase healthy adjustment to current life circumstances and improve  healthy communication skills and explore coping and relaxation skills.   Progress towards Goals: Ongoing  Interventions: Interventions utilized:  Motivational Interviewing, CBT Cognitive Behavioral Therapy, Supportive Counseling, Psychoeducation and/or Health Education, Communication Skills, and Supportive Reflection Standardized Assessments completed: Not Needed  Engaged patient in CBT cognitive triangle. Explored negative thoughts patient has when communicating with unknown people in unfamiliar spaces such as calling for appointments and asking questions. Explored the thought, feeling, then behavior. Processed their connections and how they impact patients actions or non action.   Patient and/or Family Response: Patient responsive to clinicians assessment and intervention. Patient reports understanding the connection between her thoughts, feelings, and behaviors.   Patient Centered Plan: Patient is on the following Treatment Plan(s): Patient will learn skills to communicate with new people, specifically when making appointments and asking questions, especially as it relates to her medical care as she transitions into adult endocrinology. It will be important for patient to learn how to advocate for herself. Patient will start by going to the store without her sisters at least one time between this session and the next to begin gaining and building on independence and individuality.    Plan: Follow up with behavioral health clinician on : 3 weeks Behavioral recommendations: see goals addressed and patient centered plan Referral(s): [facility] (In Clinic) "From scale of 1-10, how likely are you to follow plan?": likely  Valda Favia, LCSW

## 2022-09-10 ENCOUNTER — Telehealth: Payer: Self-pay | Admitting: Pediatrics

## 2022-09-10 DIAGNOSIS — Z4681 Encounter for fitting and adjustment of insulin pump: Secondary | ICD-10-CM

## 2022-09-10 DIAGNOSIS — E1065 Type 1 diabetes mellitus with hyperglycemia: Secondary | ICD-10-CM

## 2022-09-10 DIAGNOSIS — Z978 Presence of other specified devices: Secondary | ICD-10-CM

## 2022-09-10 MED ORDER — OMNIPOD 5 DEXG7G6 PODS GEN 5 MISC: 1.0000 | 0 refills | Status: DC

## 2022-09-10 MED ORDER — DEXCOM G6 TRANSMITTER MISC: 1.0000 | 0 refills | Status: DC

## 2022-09-10 MED ORDER — DEXCOM G6 SENSOR MISC: 1.0000 | 0 refills | Status: DC

## 2022-09-10 NOTE — Telephone Encounter (Signed)
  Name of who is calling:Idamay   Caller's Relationship to Patient:self   Best contact [phone]  Provider they see:DR. [place]   Reason for call:caller requested a refill for the Omni Pods and a PA for the dexcom and the sensors to be sent to [facility]  Name of prescription:Omnii pods   Pharmacy:[facility] [address]. [place], [place]

## 2022-09-15 ENCOUNTER — Telehealth (INDEPENDENT_AMBULATORY_CARE_PROVIDER_SITE_OTHER): Payer: Self-pay

## 2022-09-15 NOTE — Telephone Encounter (Signed)
Received fax from pharmacy/covermymeds to complete prior authorization initiated on covermymeds, completed prior authorization  Sensors:     Transmitters:     Pharmacy would like notification of determination [facility] P:  [phone] F:  [phone]

## 2022-09-16 NOTE — Telephone Encounter (Signed)
Received denial letters, they did not see documentation that patient is using the system as prescribed.  Notified provider

## 2022-09-22 ENCOUNTER — Ambulatory Visit (INDEPENDENT_AMBULATORY_CARE_PROVIDER_SITE_OTHER): Payer: 59 | Admitting: Licensed Clinical Social Worker

## 2022-09-22 ENCOUNTER — Telehealth (INDEPENDENT_AMBULATORY_CARE_PROVIDER_SITE_OTHER): Payer: Self-pay | Admitting: Pediatrics

## 2022-09-22 DIAGNOSIS — F4322 Adjustment disorder with anxiety: Secondary | ICD-10-CM | POA: Diagnosis not present

## 2022-09-22 NOTE — BH Specialist Note (Unsigned)
Integrated Behavioral Health Follow Up In-Person Visit  MRN: [id number] Name: Diane [name]  Number of [facility] Clinician visits: Third Visit (3/6)  Session Start time: 10:45 am Session End time: 11:20 am Total time in minutes: 35 minutes  Types of Service: Individual psychotherapy  Interpretor:No.   Diane [name] is a 22 y.o. female accompanied by  self.  Patient was referred by Dr. Quincy Sheehan for depression and transition to adult care provider for Endocrinology.   Subjective:  -Patient reports she has an upcoming appointment with Dr Quincy Sheehan on Friday. -Patient has continued to inquire about about Dexcom with care team. -Patient reports she plans on bringing glucose meter with her to Fridays appointment since she did not bring it today.  -Patient reports she has her appointment with adult endocrinology in June and reports she needed to call them back to give them her information. -Patient reports she was not able to complete the goal set in last session.    Duration of initial  problem: about a year; Severity of problem: mild   Objective: Mood: Euthymic and Affect: Appropriate Risk of harm to self or others: No plan to harm self or others  Patient and/or Family's Strengths/Protective Factors: Concrete supports in place (healthy food, safe environments, etc.) and familial support and involvement, and bond with sisters.  Goals Addressed: Patient will:  Reduce symptoms of: anxiety and including social anxiety, and mild stress associated with Dexcom not getting approved. Patient has been managing her diabetes manually.    Increase knowledge and/or ability of: coping skills and stress reduction   Demonstrate ability to: Increase healthy adjustment to current life circumstances and improve healthy communication skills and explore coping and relaxation skills.  Progress towards Goals: Ongoing  Interventions: Interventions utilized:  Motivational Interviewing,  Solution-Focused Strategies, CBT Cognitive Behavioral Therapy, Supportive Counseling, Psychoeducation and/or Health Education, and Supportive Reflection Standardized Assessments completed: Not Needed  Used psychoeducation about cognitive distortions, especially as it relates to "what if" thinking in relation to patients anxiety about communicating with others, socially and making calls for appointments for medical care. Engaged patient in goal setting, specifically as it relates to getting authorized for her Dexcom. During appointment, Angelene Giovanni, RN was able to answer patients questions and provide understanding as to why patients Dexcom has not been authorized yet.   Patient and/or Family Response: Patient responsive to assessment and recommendation by clinician and Tresa Endo, RN. Patient voiced understanding of cognitive distortions and how they can impact a persons thoughts, feelings and behaviors. Patient set a goal and plan to check her numbers 4-6 times a day starting after this appointment until her appointment on Friday with Dr. Quincy Sheehan. Patient reports feeling a little overwhelmed by this but feels motivated to do it if that meant she could get the Dexcom.   Patient Centered Plan: Patient is on the following Treatment Plan(s): Patient will practice skills to communicate with new people, specifically when making appointments and asking questions. This will be especially important as she transitions into adult endocrinology. Patient will keep the goal of going to the store without her sister or mother at least one time between this session and the next to begin gaining and building on independence and individuality.  Patient will also call adult endocrinology back to to provide them with her information to set up and confirm her appointment for June [facility].    Plan: Follow up with behavioral health clinician on : in 4 weeks Behavioral recommendations: Patient will  check  number 4-6 times a day before appointment with Dr. Quincy Sheehan to start process of getting Dexcom which will reduce patient stress associated with manually managing her diabetes. Referral(s): [facility] (In Clinic) "From scale of 1-10, how likely are you to follow plan?": likely  Jill Side, LCSW

## 2022-09-22 NOTE — Telephone Encounter (Signed)
Who's calling (name and relationship to patient) : Self   Best contact number:[phone]   Provider they see: Quincy Sheehan  Reason for call: Xenia came in for a visit and stated that her Dexcom g6 was declined because a pre authorization form was not submitted and requested for it to br    Call ID:      PRESCRIPTION REFILL ONLY  Name of prescription:  Pharmacy:

## 2022-09-22 NOTE — Telephone Encounter (Signed)
Spoke with patient at the end of today's visit.  Explained that she hadn't been checking her BG as often as she needed too and there wasn't enough data to show the insurance you she was checking as prescribed.  She did not bring glucometer today.  Patient stated that she is using her pump. She was unable to confirm if she was checking at a minimum 4 times per day.  She has an upcoming appointment with Dr. Quincy Sheehan on Friday.  Explained that I need her to check her BG at a minimum 4 times preferably 6 times per day, all meals, snacks, when she wakes and before she goes to bed.  She needs to enter all data into her pump as well.  Depending on that information we may be able to resubmit but that is a requirement at a minimum to try to get her G6 started back.  She verbalized understanding.

## 2022-09-26 ENCOUNTER — Other Ambulatory Visit: Payer: Self-pay

## 2022-09-26 ENCOUNTER — Encounter (INDEPENDENT_AMBULATORY_CARE_PROVIDER_SITE_OTHER): Payer: Self-pay

## 2022-09-26 ENCOUNTER — Ambulatory Visit (INDEPENDENT_AMBULATORY_CARE_PROVIDER_SITE_OTHER): Payer: 59 | Admitting: Pediatrics

## 2022-09-26 VITALS — BP 112/68 | HR 74 | Wt 123.2 lb

## 2022-09-26 DIAGNOSIS — Z978 Presence of other specified devices: Secondary | ICD-10-CM

## 2022-09-26 DIAGNOSIS — J301 Allergic rhinitis due to pollen: Secondary | ICD-10-CM | POA: Diagnosis not present

## 2022-09-26 DIAGNOSIS — Z4681 Encounter for fitting and adjustment of insulin pump: Secondary | ICD-10-CM

## 2022-09-26 DIAGNOSIS — E1065 Type 1 diabetes mellitus with hyperglycemia: Secondary | ICD-10-CM

## 2022-09-26 DIAGNOSIS — E10649 Type 1 diabetes mellitus with hypoglycemia without coma: Secondary | ICD-10-CM

## 2022-09-26 LAB — POCT GLYCOSYLATED HEMOGLOBIN (HGB A1C): Hemoglobin A1C: 6.4 % — AB (ref 4.0–5.6)

## 2022-09-26 LAB — POCT GLUCOSE (DEVICE FOR HOME USE): Glucose Fasting, POC: 113 mg/dL — AB (ref 70–99)

## 2022-09-26 MED ORDER — DEXCOM G6 SENSOR MISC
1.0000 | 0 refills | Status: DC
Start: 2022-09-26 — End: 2022-11-26

## 2022-09-26 MED ORDER — DEXCOM G6 TRANSMITTER MISC: 1.0000 | 0 refills | Status: DC

## 2022-09-26 NOTE — Patient Instructions (Signed)
DISCHARGE INSTRUCTIONS FOR Diane [name]  09/26/2022  HbA1c Goals: Our ultimate goal is to achieve the lowest possible HbA1c while avoiding recurrent severe hypoglycemia.  However, all HbA1c goals must be individualized per the American Diabetes Association Clinical Standards.  My Hemoglobin A1c History:  Lab Results  Component Value Date   HGBA1C 6.4 (A) 09/26/2022   HGBA1C >14 06/27/2022   HGBA1C 8.7 (A) 03/17/2022   HGBA1C 5.8 (A) 07/10/2021   HGBA1C 5.8 (A) 04/08/2021   HGBA1C 9.0 (A) 12/06/2020   HGBA1C >15.5 (H) 03/10/2020   HGBA1C >15.5 (H) 03/10/2020    My goal HbA1c is: < 7 %  This is equivalent to an average blood glucose of:   HbA1c % = Average BG  5  97 (78-120)__ 6  126 (100-152)  7  154 (123-185) 8  183 (147-217)  9  212 (170-249)  10  240 (193-282)  11  269 (217-314)  12  298 (240-347)  13  330    Time in Range (TIR) Goals: Target Range over 70% of the time and Very Low less than 4% of the time.   Insulin:  Time Basal Rate (U/hr) ISF/CF Carb Ratio Target (mg/dL)  98JX 0.6 50 10 914        9AM 0.65 50 10 120  4PM 0.6 50 10 120   Basal: 14.45   DIABETES PLAN  Rapid Acting Insulin (Novolog/FiASP (Aspart) and Humalog/Lyumjev (Lispro))  **Given for Food/Carbohydrates and High Sugar/Glucose**   DAYTIME (breakfast, lunch, dinner) Target Blood Glucose 125 mg/dL Insulin Sensitivity Factor 50 Insulin to Carb Ratio  1 unit for 10 grams   Correction DOSE Food DOSE  (Glucose -Target)/Insulin Sensitivity Factor  Glucose (mg/dL) Units of Rapid Acting Insulin  Less than 125 0  126-175 1  176-225 2  226-275 3  276-325 4  326-375 5  376-425 6  426-475 7  476-525 8  526-575 9  576 or more 10   Number of carbohydrates divided by carb ratio  Number of Carbs Units of Rapid Acting Insulin  0-9 0  10-19 1  20-29 2  30-39 3  40-49 4  50-59 5  60-69 6  70-79 7  80-89 8  90-99 9  100-109 10  110-119 11  120-129 12  130-139 13  140-149 14   150-159 15  160+  (# carbs divided by 10)                 **Correction Dose + Food Dose = Number of units of rapid acting insulin **  Correction for High Sugar/Glucose Food/Carbohydrate  Measure Blood Glucose BEFORE you eat. (Fingerstick with Glucose Meter or check the reading on your Continuous Glucose Meter).  Use the table above or calculate the dose using the formula.  Add this dose to the Food/Carbohydrate dose if eating a meal.  Correction should not be given sooner than every 3 hours since the last dose of rapid acting insulin. 1. Count the number of carbohydrates you will be eating.  2. Use the table above or calculate the dose using the formula.  3. Add this dose to the Correction dose if glucose is above target.         BEDTIME Target Blood Glucose 200 mg/dL Insulin Sensitivity Factor 50 Insulin to Carb Ratio  1 unit for 10 grams   Wait at least 3 hours after taking dinner dose of insulin BEFORE checking bedtime glucose.   Blood Sugar Less Than  /dL? Blood  Sugar Between 126 - /dL? Blood Sugar Greater Than /dL?  You MUST EAT 10-15 carbs  1. Carb snack not needed  Carb snack not needed    2. Additional, Optional Carb Snack?  If you want more carbs, you CAN eat them now! Make sure to subtract MUST EAT carbs from total carbs then look at chart below to determine food dose. 2. Optional Carb Snack?   You CAN eat this! Make sure to add up total carbs then look at chart below to determine food dose. 2. Optional Carb Snack?   You CAN eat this! Make sure to add up total carbs then look at chart below to determine food dose.  3. Correction Dose of Insulin?  NO  3. Correction Dose of Insulin?  NO 3. Correction Dose of Insulin?  YES; please look at correction dose chart to determine correction dose.   Glucose (mg/dL) Units of Rapid Acting Insulin  Less than 200 0  201-250 1  251-300 2  301-350 3  351-400 4  401-450 5  451-500 6  501-550 7   551 or more 8   Number of Carbs Units of Rapid Acting Insulin  0-9 0  10-19 1  20-29 2  30-39 3  40-49 4  50-59 5  60-69 6  70-79 7  80-89 8  90-99 9  100-109 10  110-119 11  120-129 12  130-139 13  140-149 14  150-159 15  160+  (# carbs divided by 10)          Long Acting Insulin (Glargine (Basaglar/Lantus/Semglee)/Levemir/Tresiba)  **Remember long acting insulin must be given EVERY DAY, and NEVER skip this dose**                                    Give 15 units at 10pm    If you have any questions/concerns PLEASE call [phone] to speak to the on-call  Pediatric Endocrinology provider at [facility].  [name], MD   Medications:  Start Allegra (Fexofenadine  nightly) or start allergy steroid nasal spray: 1 spray each nostril twice a day.   Continue  as currently prescribed  Please allow 3 days for prescription refill requests! After hours are for emergencies only.   Check Blood Glucose:  Before breakfast, before lunch, before dinner, at bedtime, and for symptoms of high or low blood glucose as a minimum.  Check BG 2 hours after meals if adjusting doses.   Check more frequently on days with more activity than normal.   Check in the middle of the night when evening insulin doses are changed, on days with extra activity in the evening, and if you suspect overnight low glucoses are occurring.   Send a MyChart message as needed for patterns of high or low glucose levels, or multiple low glucoses.  As a general rule, ALWAYS call us to review your child's blood glucoses IF: Your child has a seizure You have to use glucagon/Baqsimi/Gvoke or glucose gel to bring up the blood sugar  IF you notice a pattern of high blood sugars  If in a week, your child has: 1 blood glucose that is 40 or less  2 blood glucoses that are 50 or less at the same time of day 3 blood glucoses that are 60 or less at the same time of day  Phone:  [phone]  Ketones: Check urine or blood ketones, and if  blood glucose is greater than 300 mg/dL (injections) or 413 mg/dL (pump), when ill, or if having symptoms of ketones.  Call if Urine Ketones are moderate or large Call if Blood Ketones are moderate (1-1.5) or large (more than1.5)  Exercise Plan:  Any activity that makes you sweat most days for 60 minutes.   Safety: Wear Medical Alert at [facility] Times Citizens requesting the Yellow Dot Packages should contact Airline pilot at the [facility] by calling [phone] or e-mail aalmono@[place] .gov.  TEEN REMINDERS:  Check blood glucose before driving If sexually active, use reliable birth control including condoms.  Alcohol in moderation only - check glucoses more frequently, & have a snack with no carb coverage. Glucose gel/cake icing for low glucose. Check glucoses in the middle of the night.  Other: Schedule an eye exam yearly and a dental exam.  Recommend dental cleaning every 6 months. Get a flu vaccine yearly, and Covid-19 vaccine yearly unless contraindicated. Rotate injections sites and avoid any hard lumps (lipohypertrophy)

## 2022-09-26 NOTE — Progress Notes (Addendum)
Assisted patient in placing new Dexcom G6 on it.  Confirmed placement appropriate with Omnipod 5 line of site.  Patient successfully cleaning area, applied skin prep, waited until site was ready and she applied new sensor.  She placed transmitter successfully, logged into app and started sensor.    Dexcom G6 patient education Person(s)instructed: Patient  Instruction: Patient oriented to three components of Dexcom G6 continuous glucose monitor (sensor, transmitter, receiver/cellphone) Receiver or cellphone: phone -Dexcom G6 AND dexcom clarity app downloaded onto cellphone: yes  -Patient educated that Dexom G6 app must always be running (patient should not close out of app) -If using Dexcom G6 app, patient may share blood glucose data with up to 10 followers on dexcom follow app.  CGM overview and set-up  1. Button, touch screen, and icons 2. Power supply and recharging 3. Home screen 4. Date and time 5. Set BG target range 6. Set alarm/alert tone  --Low alarm:  mg/dL --High alarm:  mg/dL 7. Interstitial vs. capillary blood glucose readings  8. When to verify sensor reading with fingerstick blood glucose 9. Blood glucose reading measured every five minutes. 10. Sensor will last 10 days 11. Transmitter will last 90 days and must be reused  12. Transmitter must be within 20 feet of receiver/cell phone.  Sensor application -- sensor placed on lower abdomen 1. Site selection and site prep with alcohol pad 2. Sensor prep-sensor pack and sensor applicator 3. Sensor applied to area away from waistband, scarring, tattoos, irritation, and bones 4. Transmitter sanitized with alcohol pad and inserted into sensor. 5. Starting the sensor: 2 hour warm up before BG readings available 6. Sensor change every 10 days and rotate site 7. Call Dexcom customer service if sensor comes off before 10 days  Safety and Troubleshooting 1. Do a fingerstick blood glucose test if the sensor readings do not  match how    you feel 2. Remove sensor prior to magnetic resonance imaging (MRI), computed tomography (CT) scan, or high-frequency electrical heat (diathermy) treatment. 3. Do not allow sun screen or insect repellant to come into contact with Dexcom G6. These skin care products may lead for the plastic used in the Dexcom G6 to crack. 4. Dexcom G6 may be worn through a Industrial/product designer. It may not be exposed to an advanced Imaging Technology (AIT) body scanner (also called a millimeter wave scanner) or the baggage x-ray machine. Instead, ask for hand-wanding or full-body pat-down and visual inspection.  5. Doses of acetaminophen (Tylenol) >1 gram every 6 hours may cause false high readings. 6. Hydroxyurea (Hydrea, Droxia) may interfere with accuracy of blood glucose readings from Dexcom G6. 7. Store sensor kit between 36 and 86 degrees Farenheit. Can be refrigerated within this temperature range.  Contact information provided for [facility] customer service and/or trainer.  Assessment: Dexcom G6 CGM placed on patient's upper left Arm. Show to where to get Skin Tac to assist with CGM adhesion.   Plan: Monitoring:  Continue wearing Dexcom G6 CGM Tanekia R Tait has a diagnosis of diabetes, checks blood glucose readings > 4x per day, treats with Omnipod 5 for insulin delivery and this pump only connects to Dexcom G6, and requires frequent adjustments to insulin regimen. Editha will be seen every six months, minimally, to assess adherence to their CGM regimen and diabetes treatment plan. Tanyika and caregiver are willing to use device as prescribed. Follow Up: 2 months

## 2022-09-26 NOTE — Progress Notes (Signed)
Pediatric Endocrinology Diabetes Consultation Follow-up Visit  Diane [name] 2001-03-13 [id number] Diane Corning, MD   HPI: Diane [name]  is a 22 y.o. female presenting for follow-up of Type 1 Diabetes.   she is accompanied to this visit by her  self .Interpeter present throughout the visit: No.  Since last visit on 08/08/2022, she has been well.  There have been no ER visits or hospitalizations. Entering BG into pump when correction needed. She has been seeing our Diane [name] provider with improvement in mood and diabetes compliance. She is checking her glucose up to 5-6x/day now and wearing her insulin pump again. She is pleased that her glucoses have improved, but sometimes has lows when she wakes up later around 8-9AM. Pump is on manual mode.  Back Up Insulin regimen in Case of Pump Failure: 0.27units/kg/day Basal: Lantus 15 units daily Bolus: FiASP   Carb ratio: 1:10   ISF: 50   Target: 120 mg/dL  Pump Download:         Hypoglycemia: can feel most low blood sugars.  No glucagon needed recently.  Blood glucose download: Glucose Meter: Accucheck  Average glucose checks 3-5/day Average glucose 116 mg/dL Pattern of none   CGM download: Dexcom G6  Insurance has not authorized refills, so unable to wear.   Med-alert ID: is not currently wearing. Injection/Pump sites: lower extremity Annual labs last: 03/2023 Annual Foot Exam last: 11/15/21-nl Ophthalmology last: 2023 no retinopathy. Flu vaccine: 2022 COVID vaccine: x2, Covid-19 x1 ROS: Greater than 10 systems reviewed with pertinent positives listed in HPI, otherwise neg.  The following portions of the patient's history were reviewed and updated as appropriate:  Past Medical History:  has a past medical history of Asthma.  Medications:  Outpatient Encounter Medications as of 09/26/2022  Medication Sig   Accu-Chek FastClix Lancets MISC Check sugar up to 6 times daily. For use with FAST CLIX Lancet Device   acetone, urine, test  strip Check ketones per protocol   albuterol (PROVENTIL HFA;VENTOLIN HFA) 108 (90 Base) MCG/ACT inhaler Inhale 2 puffs into the lungs every 4 (four) hours as needed for wheezing or shortness of breath.   Blood Glucose Monitoring Suppl (ACCU-CHEK GUIDE) w/Device KIT 1 each by Does not apply route as directed.   glucose blood (ACCU-CHEK GUIDE) test strip Use as instructed for 6 checks per day plus per protocol for hyper/hypoglycemia   insulin aspart (FIASP FLEXTOUCH) 100 UNIT/ML FlexTouch Pen Inject up to 50 units subcutaneously daily as instructed.   Insulin Aspart, w/Niacinamide, (FIASP) 100 UNIT/ML SOLN Inject up to 200 units every 2-3 days. Please fill for [employer].   Insulin Disposable Pump (OMNIPOD 5 G6 PODS, GEN 5,) MISC Inject 1 Device into the skin as directed. Change pod every 2 days. Patient will need 3 boxes (each contain 5 pods) for a 30 day supply. Please fill for [facility] [id number].   insulin glargine (LANTUS SOLOSTAR) 100 UNIT/ML Solostar Pen Up to 50 units per day as directed by MD, in case of pump failure.   Insulin Pen Needle (INSUPEN PEN NEEDLES) 32G X 4 MM MISC BD Pen Needles- brand specific. Inject insulin via insulin pen 6 x daily   Lancets Misc. (ACCU-CHEK FASTCLIX LANCET) KIT Check sugar 6 times daily   cetirizine (ZYRTEC) 10 MG tablet Take 10 mg by mouth daily as needed for allergies. (Patient not taking: Reported on 08/08/2022)   Continuous Blood Gluc Receiver (DEXCOM G6 RECEIVER) DEVI 1 Device by Does not apply route as directed. (Patient not taking: Reported  on 08/08/2022)   Continuous Blood Gluc Sensor (DEXCOM G6 SENSOR) MISC Inject 1 applicator into the skin as directed. (change sensor every 10 days)   Continuous Blood Gluc Transmit (DEXCOM G6 TRANSMITTER) MISC Inject 1 Device into the skin as directed. (re-use up to 8x with each new sensor)   ferrous sulfate 325 (65 FE) MG tablet Take 1 tablet (325 mg total) by mouth daily. (Patient not taking: Reported on 07/10/2021)    Glucagon (BAQSIMI TWO PACK) 3 MG/DOSE POWD Place 1 each into the nose as needed (severe hypoglycmia with unresponsiveness). (Patient not taking: Reported on 08/08/2022)   hydrocortisone 1 % ointment Apply 1 application. topically 2 (two) times daily. (Patient not taking: Reported on 08/08/2022)   injection device for insulin (INPEN 100-GREY-NOVO) DEVI Use InPen with insulin cartridges to inject insulin up to 8x per day. (Patient not taking: Reported on 08/08/2022)   Insulin Lispro-aabc (LYUMJEV KWIKPEN) 100 UNIT/ML KwikPen Inject up to 50 units subcutaneously daily as instructed. (Patient not taking: Reported on 08/08/2022)   ondansetron (ZOFRAN-ODT) 4 MG disintegrating tablet Take 1 tablet (4 mg total) by mouth every 8 (eight) hours as needed for nausea or vomiting. (Patient not taking: Reported on 08/08/2022)   selenium sulfide (SELSUN) 2.5 % shampoo Apply topically. (Patient not taking: Reported on 08/08/2022)   [DISCONTINUED] Continuous Blood Gluc Sensor (DEXCOM G6 SENSOR) MISC Inject 1 applicator into the skin as directed. (change sensor every 10 days) (Patient not taking: Reported on 09/26/2022)   [DISCONTINUED] Continuous Blood Gluc Transmit (DEXCOM G6 TRANSMITTER) MISC Inject 1 Device into the skin as directed. (re-use up to 8x with each new sensor) (Patient not taking: Reported on 09/26/2022)   No facility-administered encounter medications on file as of 09/26/2022.    Allergies: No Known Allergies  Surgical History: Past Surgical History:  Procedure Laterality Date   CYSTECTOMY      Family History: family history includes Anemia in her maternal grandmother; Asthma in her maternal grandfather and sister; Diabetes in her paternal grandmother; Thyroid disease in her maternal aunt and maternal grandmother.   Social History: Social History   Social History Narrative   Lives with parents and 4 siblings   Attends [employer] for physical therapy.     Physical Exam:  Vitals:   09/26/22 1057  BP:  112/68  Pulse: 74  Weight: 123 lb 3.2 oz (55.9 kg)   BP 112/68   Pulse 74   Wt 123 lb 3.2 oz (55.9 kg)   BMI 18.46 kg/m  Body mass index: body mass index is 18.46 kg/m. Blood pressure %iles are not available for patients who are 18 years or older. 10 %ile (Z= -1.28) based on CDC (Girls, 2-20 Years) BMI-for-age data using weight from 09/26/2022 and height from 05/19/2022.   Ht Readings from Last 3 Encounters:  05/19/22 5' 8.5" (1.74 m) (95 %, Z= 1.66)*  03/17/22 5' 8.47" (1.739 m) (95 %, Z= 1.65)*  11/15/21 5' 8.47" (1.739 m) (95 %, Z= 1.65)*   * Growth percentiles are based on CDC (Girls, 2-20 Years) data.   Wt Readings from Last 3 Encounters:  09/26/22 123 lb 3.2 oz (55.9 kg) (42 %, Z= -0.21)*  08/08/22 127 lb 6.4 oz (57.8 kg) (50 %, Z= 0.01)*  06/27/22 119 lb 12.8 oz (54.3 kg) (36 %, Z= -0.37)*   * Growth percentiles are based on CDC (Girls, 2-20 Years) data.    Physical Exam Vitals reviewed.  Constitutional:      Appearance: Normal appearance. She is not  toxic-appearing.  HENT:     Head: Normocephalic and atraumatic.     Nose: Nose normal.     Mouth/Throat:     Mouth: Mucous membranes are moist.  Eyes:     Extraocular Movements: Extraocular movements intact.  Pulmonary:     Effort: Pulmonary effort is normal. No respiratory distress.  Abdominal:     General: There is no distension.  Musculoskeletal:        General: Normal range of motion.     Cervical back: Normal range of motion and neck supple.  Skin:    General: Skin is warm.  Neurological:     General: No focal deficit present.     Mental Status: She is alert.     Gait: Gait normal.  Psychiatric:        Mood and Affect: Mood normal.        Behavior: Behavior normal.      Labs: Lab Results  Component Value Date   ISLETAB Negative 03/10/2020  ,  Lab Results  Component Value Date   INSULINAB <5.0 03/10/2020  ,  Lab Results  Component Value Date   GLUTAMICACAB 1,417.2 (H) 03/10/2020  , No  results found for: "ZNT8AB" No results found for: "LABIA2"  Last hemoglobin A1c:  Lab Results  Component Value Date   HGBA1C 6.4 (A) 09/26/2022   Results for orders placed or performed in visit on 09/26/22  POCT Glucose (Device for Home Use)  Result Value Ref Range   Glucose Fasting, POC 113 (A) 70 - 99 mg/dL   POC Glucose    POCT glycosylated hemoglobin (Hb A1C)  Result Value Ref Range   Hemoglobin A1C 6.4 (A) 4.0 - 5.6 %   HbA1c POC (<> result, manual entry)     HbA1c, POC (prediabetic range)     HbA1c, POC (controlled diabetic range)      Lab Results  Component Value Date   HGBA1C 6.4 (A) 09/26/2022   HGBA1C >14 06/27/2022   HGBA1C 8.7 (A) 03/17/2022    Lab Results  Component Value Date   MICROALBUR 0.2 03/17/2022   LDLCALC 118 (H) 03/17/2022   CREATININE 0.85 04/08/2021    Lab Results  Component Value Date   TSH 1.66 03/17/2022   FREE T4 1.2 03/17/2022     Assessment/Plan: Iley is a 22 y.o. female with The primary encounter diagnosis was Uncontrolled type 1 diabetes mellitus with hypoglycemia without coma. Diagnoses of Insulin pump titration, Uses self-applied continuous glucose monitoring device, Seasonal allergic rhinitis due to pollen, and Uncontrolled type 1 diabetes mellitus with hyperglycemia were also pertinent to this visit.. Diabetes mellitus Type I, under fair control. A1c is below goal of 7% or lower. She has agreed to continue meeting with [facility]. HbA1c has decreased over 5% due to increased compliance. Basal decreased overnight. Dexcom, CGM, re-education provided and Dexcom G6 sample provided.  Appeal will be sent to insurance company as she meets criteria to have this device and is medically necessary for her pump to function correctly. Without CGM, the pump could not decrease the insulin leading to fasting hypoglycemia. This increases her risk of seizure or death. Thus, it is medically necessary for her to wear CGM.  When a patient is on insulin,  intensive monitoring of blood glucose levels and continuous insulin titration is vital to avoid hyperglycemia and hypoglycemia. Severe hypoglycemia can lead to seizure or death. Hyperglycemia can lead to ketosis requiring ICU admission and intravenous insulin.   Orders Placed This Encounter  Procedures   POCT Glucose (Device for Home Use)   POCT glycosylated hemoglobin (Hb A1C)   COLLECTION CAPILLARY BLOOD SPECIMEN    Meds ordered this encounter  Medications   Continuous Blood Gluc Sensor (DEXCOM G6 SENSOR) MISC    Sig: Inject 1 applicator into the skin as directed. (change sensor every 10 days)    Dispense:  3 each    Refill:  0    1 box = 3 sensors = 30 day supply  Please tell pt: [facility] ([facility])  [phone] - need to call to schedule appointment   Continuous Blood Gluc Transmit (DEXCOM G6 TRANSMITTER) MISC    Sig: Inject 1 Device into the skin as directed. (re-use up to 8x with each new sensor)    Dispense:  1 each    Refill:  0    1 transmitter = 90 day supply   Please tell pt: [facility] ([facility])  [phone] - need to call to schedule appointment     Patient Instructions  DISCHARGE INSTRUCTIONS FOR DAPHNEY SANZARI  09/26/2022  HbA1c Goals: Our ultimate goal is to achieve the lowest possible HbA1c while avoiding recurrent severe hypoglycemia.  However, all HbA1c goals must be individualized per the American Diabetes Association Clinical Standards.  My Hemoglobin A1c History:  Lab Results  Component Value Date   HGBA1C 6.4 (A) 09/26/2022   HGBA1C >14 06/27/2022   HGBA1C 8.7 (A) 03/17/2022   HGBA1C 5.8 (A) 07/10/2021   HGBA1C 5.8 (A) 04/08/2021   HGBA1C 9.0 (A) 12/06/2020   HGBA1C >15.5 (H) 03/10/2020   HGBA1C >15.5 (H) 03/10/2020    My goal HbA1c is: < 7 %  This is equivalent to an average blood glucose of:   HbA1c % = Average BG  5  97 (78-120)__ 6  126 (100-152)  7  154 (123-185) 8  183 (147-217)  9  212 (170-249)  10  240 (193-282)  11  269  (217-314)  12  298 (240-347)  13  330    Time in Range (TIR) Goals: Target Range over 70% of the time and Very Low less than 4% of the time.   Insulin:  Time Basal Rate (U/hr) ISF/CF Carb Ratio Target (mg/dL)  70LI 0.6 50 10 103        9AM 0.65 50 10 120  4PM 0.6 50 10 120   Basal: 14.45   DIABETES PLAN  Rapid Acting Insulin (Novolog/FiASP (Aspart) and Humalog/Lyumjev (Lispro))  **Given for Food/Carbohydrates and High Sugar/Glucose**   DAYTIME (breakfast, lunch, dinner) Target Blood Glucose 125 mg/dL Insulin Sensitivity Factor 50 Insulin to Carb Ratio  1 unit for 10 grams   Correction DOSE Food DOSE  (Glucose -Target)/Insulin Sensitivity Factor  Glucose (mg/dL) Units of Rapid Acting Insulin  Less than 125 0  126-175 1  176-225 2  226-275 3  276-325 4  326-375 5  376-425 6  426-475 7  476-525 8  526-575 9  576 or more 10   Number of carbohydrates divided by carb ratio  Number of Carbs Units of Rapid Acting Insulin  0-9 0  10-19 1  20-29 2  30-39 3  40-49 4  50-59 5  60-69 6  70-79 7  80-89 8  90-99 9  100-109 10  110-119 11  120-129 12  130-139 13  140-149 14  150-159 15  160+  (# carbs divided by 10)                 **  Correction Dose + Food Dose = Number of units of rapid acting insulin **  Correction for High Sugar/Glucose Food/Carbohydrate  Measure Blood Glucose BEFORE you eat. (Fingerstick with Glucose Meter or check the reading on your Continuous Glucose Meter).  Use the table above or calculate the dose using the formula.  Add this dose to the Food/Carbohydrate dose if eating a meal.  Correction should not be given sooner than every 3 hours since the last dose of rapid acting insulin. 1. Count the number of carbohydrates you will be eating.  2. Use the table above or calculate the dose using the formula.  3. Add this dose to the Correction dose if glucose is above target.         BEDTIME Target Blood Glucose 200 mg/dL Insulin  Sensitivity Factor 50 Insulin to Carb Ratio  1 unit for 10 grams   Wait at least 3 hours after taking dinner dose of insulin BEFORE checking bedtime glucose.   Blood Sugar Less Than  125mg /dL? Blood Sugar Between 126 - 199mg /dL? Blood Sugar Greater Than 200mg /dL?  You MUST EAT 10-15 carbs  1. Carb snack not needed  Carb snack not needed    2. Additional, Optional Carb Snack?  If you want more carbs, you CAN eat them now! Make sure to subtract MUST EAT carbs from total carbs then look at chart below to determine food dose. 2. Optional Carb Snack?   You CAN eat this! Make sure to add up total carbs then look at chart below to determine food dose. 2. Optional Carb Snack?   You CAN eat this! Make sure to add up total carbs then look at chart below to determine food dose.  3. Correction Dose of Insulin?  NO  3. Correction Dose of Insulin?  NO 3. Correction Dose of Insulin?  YES; please look at correction dose chart to determine correction dose.   Glucose (mg/dL) Units of Rapid Acting Insulin  Less than 200 0  201-250 1  251-300 2  301-350 3  351-400 4  401-450 5  451-500 6  501-550 7  551 or more 8   Number of Carbs Units of Rapid Acting Insulin  0-9 0  10-19 1  20-29 2  30-39 3  40-49 4  50-59 5  60-69 6  70-79 7  80-89 8  90-99 9  100-109 10  110-119 11  120-129 12  130-139 13  140-149 14  150-159 15  160+  (# carbs divided by 10)          Long Acting Insulin (Glargine (Basaglar/Lantus/Semglee)/Levemir/Tresiba)  **Remember long acting insulin must be given EVERY DAY, and NEVER skip this dose**                                    Give 15 units at 10pm    If you have any questions/concerns PLEASE call [phone] to speak to the on-call  Pediatric Endocrinology provider at [name].  [name], MD   Medications:  Start Allegra (Fexofenadine 180mg  nightly) or start allergy steroid nasal spray: 1 spray each nostril twice a  day.   Continue  as currently prescribed  Please allow 3 days for prescription refill requests! After hours are for emergencies only.   Check Blood Glucose:  Before breakfast, before lunch, before dinner, at bedtime, and for symptoms of high or low blood glucose as a minimum.  Check BG 2 hours after meals if adjusting doses.   Check more frequently on days with more activity than normal.   Check in the middle of the night when evening insulin doses are changed, on days with extra activity in the evening, and if you suspect overnight low glucoses are occurring.   Send a MyChart message as needed for patterns of high or low glucose levels, or multiple low glucoses.  As a general rule, ALWAYS call us to review your child's blood glucoses IF: Your child has a seizure You have to use glucagon/Baqsimi/Gvoke or glucose gel to bring up the blood sugar  IF you notice a pattern of high blood sugars  If in a week, your child has: 1 blood glucose that is 40 or less  2 blood glucoses that are 50 or less at the same time of day 3 blood glucoses that are 60 or less at the same time of day  Phone: [phone]  Ketones: Check urine or blood ketones, and if blood glucose is greater than 300 mg/dL (injections) or 098 mg/dL (pump), when ill, or if having symptoms of ketones.  Call if Urine Ketones are moderate or large Call if Blood Ketones are moderate (1-1.5) or large (more than1.5)  Exercise Plan:  Any activity that makes you sweat most days for 60 minutes.   Safety: Wear Medical Alert at [facility] Citizens requesting the Yellow Dot Packages should contact Airline pilot at the [name] [place] by calling [phone] or e-mail [email or web address].  TEEN REMINDERS:  Check blood glucose before driving If sexually active, use reliable birth control including condoms.  Alcohol in moderation only - check glucoses more frequently, & have a snack with no carb coverage.  Glucose gel/cake icing for low glucose. Check glucoses in the middle of the night.  Other: Schedule an eye exam yearly and a dental exam.  Recommend dental cleaning every 6 months. Get a flu vaccine yearly, and Covid-19 vaccine yearly unless contraindicated. Rotate injections sites and avoid any hard lumps (lipohypertrophy)    Follow-up:   Return in about 2 months (around 11/26/2022) for follow up.    Medical decision-making:  I have personally spent 40 minutes involved in face-to-face and non-face-to-face activities for this patient on the day of the visit. Professional time spent includes the following activities, in addition to those noted in the documentation: preparation time/chart review, ordering of medications/tests/procedures, obtaining and/or reviewing separately obtained history, counseling and educating the patient/family/caregiver, performing a medically appropriate examination and/or evaluation, referring and communicating with other health care professionals for care coordination, review and interpretation of glucose logs/continuous glucose monitor logs,  interpretation of pump downloads, and documentation in the EHR.  Thank you for the opportunity to participate in the care of our mutual patient. Please do not hesitate to contact me should you have any questions regarding the assessment or treatment plan.   Sincerely,   [name], MD

## 2022-09-30 NOTE — Telephone Encounter (Signed)
Called to follow up on this, left HIPAA approved voicemail to check mychart or call me back.

## 2022-09-30 NOTE — Telephone Encounter (Signed)
Called [employer] about denial, after discussion with the rep, we submitted it as a new start for Dexcom due to restarting and reeducation.  It is approved through Mar 29 2023.  [place] [id number]  Provider updated, sent patient mychart message.

## 2022-10-24 ENCOUNTER — Ambulatory Visit (INDEPENDENT_AMBULATORY_CARE_PROVIDER_SITE_OTHER): Payer: 59 | Admitting: Licensed Clinical Social Worker

## 2022-10-24 DIAGNOSIS — F4322 Adjustment disorder with anxiety: Secondary | ICD-10-CM | POA: Diagnosis not present

## 2022-10-24 NOTE — BH Specialist Note (Signed)
Integrated Behavioral Health Follow Up In-Person Visit  MRN: [id number] Name: Diane [name]  Number of Integrated Behavioral Health Clinician visits: Fourth Visit (3/6)  Session Start time: 1438 Session End time: 1500 Total time in minutes: 22 minutes  Types of Service: Individual psychotherapy  Interpretor:No.   Subjective: Diane [name] is a 22 y.o. female accompanied by  self.  Patient was referred by Dr. Quincy Sheehan for depression and transition to adult care provider for Endocrinology.    Subjective:  -Patient reports she was able to get the Dexcom at last appointment with Dr. Quincy Sheehan -Patient reports she has been consistent with wearing it and checking her numbers, further reporting things have been easier to manage since having it. -Patient reports feeling less stressed about diabetes management and in general. -Patient reports starting a new job at [employer] on Monday and feeling good about it.  -Patient reports her adult endocrinology appointment got pushed back to September but she will see Dr. Quincy Sheehan in June.    Duration of initial problem: about a year; Severity of problem: mild   Objective: Mood: Euthymic and Affect: Appropriate Risk of harm to self or others: No plan to harm self or others  Patient and/or Family's Strengths/Protective Factors: Concrete supports in place (healthy food, safe environments, etc.) and familial support and involvement, and bond with sisters.  Goals Addressed: Patient will:  Reduce symptoms of: anxiety and including social anxiety, and mild stress associated with Dexcom not getting approved. Patient has been managing her diabetes manually.    Increase knowledge and/or ability of: coping skills and stress reduction   Demonstrate ability to: Increase healthy adjustment to current life circumstances and improve healthy communication skills and explore coping and relaxation skills.  Progress towards  Goals: achieved  Interventions: Interventions utilized:  Motivational Interviewing, Mindfulness or Management consultant, Supportive Counseling, Psychoeducation and/or Health Education, and Supportive Reflection Standardized Assessments completed: Not Needed  Patient and/or Family Response: Patient receptive to clinician assessment. Patient reports feeling less stress associated with diabetes management since getting the Dexcom. Patient reports doing better at managing what if thinking.  Patient Centered Plan: Patient is on the following Treatment Plan(s): Patient will continue practice skills to communicate with new people, specifically when making appointments and asking questions. This will be especially important as she transitions from pediatric doctor offices to adult doctor offices.   Plan: Follow up with behavioral health clinician on : no follow up Behavioral recommendations: Continue recommendations  Referral(s):  none at this time "From scale of 1-10, how likely are you to follow plan?": likely  Jill Side, LCSW

## 2022-10-25 ENCOUNTER — Other Ambulatory Visit: Payer: Self-pay | Admitting: Pediatrics

## 2022-10-25 ENCOUNTER — Encounter (INDEPENDENT_AMBULATORY_CARE_PROVIDER_SITE_OTHER): Payer: Self-pay | Admitting: Pediatrics

## 2022-10-25 DIAGNOSIS — Z4681 Encounter for fitting and adjustment of insulin pump: Secondary | ICD-10-CM

## 2022-10-25 DIAGNOSIS — Z978 Presence of other specified devices: Secondary | ICD-10-CM

## 2022-10-25 MED ORDER — OMNIPOD 5 DEXG7G6 PODS GEN 5 MISC
1.0000 | 0 refills | Status: DC
Start: 2022-10-25 — End: 2022-10-27

## 2022-10-27 ENCOUNTER — Other Ambulatory Visit (INDEPENDENT_AMBULATORY_CARE_PROVIDER_SITE_OTHER): Payer: Self-pay

## 2022-10-27 DIAGNOSIS — Z978 Presence of other specified devices: Secondary | ICD-10-CM

## 2022-10-27 DIAGNOSIS — Z4681 Encounter for fitting and adjustment of insulin pump: Secondary | ICD-10-CM

## 2022-10-27 MED ORDER — OMNIPOD 5 DEXG7G6 PODS GEN 5 MISC
1.0000 | 5 refills | Status: DC
Start: 2022-10-27 — End: 2022-11-26

## 2022-11-13 ENCOUNTER — Encounter (INDEPENDENT_AMBULATORY_CARE_PROVIDER_SITE_OTHER): Payer: Self-pay | Admitting: Pediatrics

## 2022-11-25 NOTE — Progress Notes (Unsigned)
Pediatric Endocrinology Diabetes Consultation Follow-up Visit Diane [name] 10/14/2000 [id number] Diane Corning, MD  HPI: Diane [name]  is a 22 y.o. female presenting for follow-up of {DIABETES TYPE PLUS:20287}. she is accompanied to this visit by her {family members:20773}.{Interpreter present throughout the visit:29436::"No"}.  Since last visit on 10/25/2022, she has been well.  There have been no ER visits or hospitalizations.  Other diabetes medication(s): {Yes/No:29440} Pump Download: *** units/kg/day {Bolus Insulin:29545}  Hypoglycemia: {can/cannot:17900} feel most low blood sugars.  No glucagon needed recently.  Blood glucose download: {Glucose Meter:29156:::1} CGM download: {Continuous Glucose Monitor:29157}  Med-alert ID: {ACTION; IS/IS WUJ:[id number]} currently wearing. Injection/Pump sites: {body part:18749} Annual labs last: *** Annual Foot Exam last: *** Ophthalmology last: ***. Last Flu vaccine: *** Last COVID vaccine: ***  ROS: Greater than 10 systems reviewed with pertinent positives listed in HPI, otherwise neg. The following portions of the patient's history were reviewed and updated as appropriate:  Past Medical History:  has a past medical history of Asthma.  Medications:  Outpatient Encounter Medications as of 11/26/2022  Medication Sig   Accu-Chek FastClix Lancets MISC Check sugar up to 6 times daily. For use with FAST CLIX Lancet Device   acetone, urine, test strip Check ketones per protocol   albuterol (PROVENTIL HFA;VENTOLIN HFA) 108 (90 Base) MCG/ACT inhaler Inhale 2 puffs into the lungs every 4 (four) hours as needed for wheezing or shortness of breath.   Blood Glucose Monitoring Suppl (ACCU-CHEK GUIDE) w/Device KIT 1 each by Does not apply route as directed.   cetirizine (ZYRTEC) 10 MG tablet Take 10 mg by mouth daily as needed for allergies. (Patient not taking: Reported on 08/08/2022)   Continuous Blood Gluc Receiver (DEXCOM G6 RECEIVER) DEVI 1 Device by  Does not apply route as directed. (Patient not taking: Reported on 08/08/2022)   Continuous Blood Gluc Sensor (DEXCOM G6 SENSOR) MISC Inject 1 applicator into the skin as directed. (change sensor every 10 days)   Continuous Blood Gluc Transmit (DEXCOM G6 TRANSMITTER) MISC Inject 1 Device into the skin as directed. (re-use up to 8x with each new sensor)   ferrous sulfate 325 (65 FE) MG tablet Take 1 tablet (325 mg total) by mouth daily. (Patient not taking: Reported on 07/10/2021)   Glucagon (BAQSIMI TWO PACK) 3 MG/DOSE POWD Place 1 each into the nose as needed (severe hypoglycmia with unresponsiveness). (Patient not taking: Reported on 08/08/2022)   glucose blood (ACCU-CHEK GUIDE) test strip Use as instructed for 6 checks per day plus per protocol for hyper/hypoglycemia   hydrocortisone 1 % ointment Apply 1 application. topically 2 (two) times daily. (Patient not taking: Reported on 08/08/2022)   injection device for insulin (INPEN 100-GREY-NOVO) DEVI Use InPen with insulin cartridges to inject insulin up to 8x per day. (Patient not taking: Reported on 08/08/2022)   insulin aspart (FIASP FLEXTOUCH) 100 UNIT/ML FlexTouch Pen Inject up to 50 units subcutaneously daily as instructed.   Insulin Aspart, w/Niacinamide, (FIASP) 100 UNIT/ML SOLN Inject up to 200 units every 2-3 days. Please fill for [employer].   Insulin Disposable Pump (OMNIPOD 5 G6 PODS, GEN 5,) MISC Inject 1 Device into the skin as directed. Change pod every 2 days. Patient will need 3 boxes (each contain 5 pods) for a 30 day supply. Please fill for [facility] [id number].   insulin glargine (LANTUS SOLOSTAR) 100 UNIT/ML Solostar Pen Up to 50 units per day as directed by MD, in case of pump failure.   Insulin Lispro-aabc (LYUMJEV KWIKPEN) 100 UNIT/ML KwikPen Inject up to  50 units subcutaneously daily as instructed. (Patient not taking: Reported on 08/08/2022)   Insulin Pen Needle (INSUPEN PEN NEEDLES) 32G X 4 MM MISC BD Pen Needles- brand specific.  Inject insulin via insulin pen 6 x daily   Lancets Misc. (ACCU-CHEK FASTCLIX LANCET) KIT Check sugar 6 times daily   ondansetron (ZOFRAN-ODT) 4 MG disintegrating tablet Take 1 tablet (4 mg total) by mouth every 8 (eight) hours as needed for nausea or vomiting. (Patient not taking: Reported on 08/08/2022)   selenium sulfide (SELSUN) 2.5 % shampoo Apply topically. (Patient not taking: Reported on 08/08/2022)   No facility-administered encounter medications on file as of 11/26/2022.   Allergies: No Known Allergies Surgical History: Past Surgical History:  Procedure Laterality Date   CYSTECTOMY     Family History: family history includes Anemia in her maternal grandmother; Asthma in her maternal grandfather and sister; Diabetes in her paternal grandmother; Thyroid disease in her maternal aunt and maternal grandmother.  Social History: Social History   Social History Narrative   Lives with parents and 4 siblings   Attends [employer] for physical therapy.    Physical Exam:  There were no vitals filed for this visit. There were no vitals taken for this visit. Body mass index: body mass index is unknown because there is no height or weight on file. Blood pressure %iles are not available for patients who are 18 years or older. No height and weight on file for this encounter.  Ht Readings from Last 3 Encounters:  05/19/22 5' 8.5" (1.74 m) (95 %, Z= 1.66)*  03/17/22 5' 8.47" (1.739 m) (95 %, Z= 1.65)*  11/15/21 5' 8.47" (1.739 m) (95 %, Z= 1.65)*   * Growth percentiles are based on CDC (Girls, 2-20 Years) data.   Wt Readings from Last 3 Encounters:  09/26/22 123 lb 3.2 oz (55.9 kg) (42 %, Z= -0.21)*  08/08/22 127 lb 6.4 oz (57.8 kg) (50 %, Z= 0.01)*  06/27/22 119 lb 12.8 oz (54.3 kg) (36 %, Z= -0.37)*   * Growth percentiles are based on CDC (Girls, 2-20 Years) data.   Physical Exam  Labs: Lab Results  Component Value Date   ISLETAB Negative 03/10/2020  ,  Lab Results  Component Value  Date   INSULINAB <5.0 03/10/2020  ,  Lab Results  Component Value Date   GLUTAMICACAB 1,417.2 (H) 03/10/2020  , No results found for: "ZNT8AB" No results found for: "LABIA2" Last hemoglobin A1c:  Lab Results  Component Value Date   HGBA1C 6.4 (A) 09/26/2022   Results for orders placed or performed in visit on 09/26/22  POCT Glucose (Device for Home Use)  Result Value Ref Range   Glucose Fasting, POC 113 (A) 70 - 99 mg/dL   POC Glucose    POCT glycosylated hemoglobin (Hb A1C)  Result Value Ref Range   Hemoglobin A1C 6.4 (A) 4.0 - 5.6 %   HbA1c POC (<> result, manual entry)     HbA1c, POC (prediabetic range)     HbA1c, POC (controlled diabetic range)     Lab Results  Component Value Date   HGBA1C 6.4 (A) 09/26/2022   HGBA1C >14 06/27/2022   HGBA1C 8.7 (A) 03/17/2022   Lab Results  Component Value Date   MICROALBUR 0.2 03/17/2022   LDLCALC 118 (H) 03/17/2022   CREATININE 0.85 04/08/2021   Lab Results  Component Value Date   TSH 1.66 03/17/2022   FREE T4 1.2 03/17/2022    Assessment/Plan: Diane [name] is a 22 y.o. female  with There were no encounter diagnoses.  There are no diagnoses linked to this encounter.  There are no Patient Instructions on file for this visit.   Follow-up:   No follow-ups on file.  Medical decision-making:  I have personally spent *** minutes involved in face-to-face and non-face-to-face activities for this patient on the day of the visit. Professional time spent includes the following activities, in addition to those noted in the documentation: preparation time/chart review, ordering of medications/tests/procedures, obtaining and/or reviewing separately obtained history, counseling and educating the patient/family/caregiver, performing a medically appropriate examination and/or evaluation, referring and communicating with other health care professionals for care coordination, *** review and interpretation of glucose logs/continuous glucose monitor logs,  *** interpretation of pump downloads, ***creating/updating school orders, and documentation in the EHR.  Thank you for the opportunity to participate in the care of our mutual patient. Please do not hesitate to contact me should you have any questions regarding the assessment or treatment plan.   Sincerely,   [name], MD

## 2022-11-26 ENCOUNTER — Ambulatory Visit (INDEPENDENT_AMBULATORY_CARE_PROVIDER_SITE_OTHER): Payer: 59 | Admitting: Pediatrics

## 2022-11-26 ENCOUNTER — Encounter (INDEPENDENT_AMBULATORY_CARE_PROVIDER_SITE_OTHER): Payer: Self-pay | Admitting: Pediatrics

## 2022-11-26 VITALS — BP 116/74 | HR 80 | Ht 68.23 in | Wt 124.3 lb

## 2022-11-26 DIAGNOSIS — Z9641 Presence of insulin pump (external) (internal): Secondary | ICD-10-CM

## 2022-11-26 DIAGNOSIS — B353 Tinea pedis: Secondary | ICD-10-CM | POA: Diagnosis not present

## 2022-11-26 DIAGNOSIS — Z978 Presence of other specified devices: Secondary | ICD-10-CM | POA: Diagnosis not present

## 2022-11-26 DIAGNOSIS — E109 Type 1 diabetes mellitus without complications: Secondary | ICD-10-CM | POA: Diagnosis not present

## 2022-11-26 DIAGNOSIS — E1065 Type 1 diabetes mellitus with hyperglycemia: Secondary | ICD-10-CM

## 2022-11-26 MED ORDER — DEXCOM G6 TRANSMITTER MISC
1.0000 | 1 refills | Status: AC
Start: 2022-11-26 — End: ?

## 2022-11-26 MED ORDER — FIASP 100 UNIT/ML IJ SOLN
INTRAMUSCULAR | 6 refills | Status: DC
Start: 2022-11-26 — End: 2023-03-12

## 2022-11-26 MED ORDER — LANTUS SOLOSTAR 100 UNIT/ML ~~LOC~~ SOPN
PEN_INJECTOR | SUBCUTANEOUS | 3 refills | Status: AC
Start: 2022-11-26 — End: ?

## 2022-11-26 MED ORDER — DEXCOM G6 SENSOR MISC: 1.0000 | 5 refills | Status: DC

## 2022-11-26 MED ORDER — OMNIPOD 5 DEXG7G6 PODS GEN 5 MISC
1.0000 | 5 refills | Status: DC
Start: 2022-11-26 — End: 2023-03-31

## 2022-11-26 MED ORDER — ONDANSETRON 4 MG PO TBDP
4.0000 mg | ORAL_TABLET | Freq: Three times a day (TID) | ORAL | 1 refills | Status: AC | PRN
Start: 2022-11-26 — End: ?

## 2022-11-26 MED ORDER — CLOTRIMAZOLE 1 % EX CREA
1.0000 | TOPICAL_CREAM | Freq: Two times a day (BID) | CUTANEOUS | 3 refills | Status: AC
Start: 2022-11-26 — End: ?

## 2022-11-26 NOTE — Patient Instructions (Signed)
DISCHARGE INSTRUCTIONS FOR Diane [name]  11/26/2022 HbA1c Goals: Our ultimate goal is to achieve the lowest possible HbA1c while avoiding recurrent severe hypoglycemia.  However, all HbA1c goals must be individualized per the American Diabetes Association Clinical Standards. My Hemoglobin A1c History:  Lab Results  Component Value Date   HGBA1C 6.4 (A) 09/26/2022   HGBA1C >14 06/27/2022   HGBA1C 8.7 (A) 03/17/2022   HGBA1C 5.8 (A) 07/10/2021   HGBA1C 5.8 (A) 04/08/2021   HGBA1C 9.0 (A) 12/06/2020   HGBA1C >15.5 (H) 03/10/2020   HGBA1C >15.5 (H) 03/10/2020   My goal HbA1c is: < 7 %  This is equivalent to an average blood glucose of:  HbA1c % = Average BG  5  97 (78-120)__ 6  126 (100-152)  7  154 (123-185) 8  183 (147-217)  9  212 (170-249)  10  240 (193-282)  11  269 (217-314)  12  298 (240-347)  13  330    Time in Range (TIR) Goals: Target Range over 70% of the time and Very Low less than 4% of the time.  Insulin:  Time Basal Rate (U/hr) ISF/CF Carb Ratio Target (mg/dL)  08MV 0.6 50 10 784        9AM 0.65 50 10 120  4PM 0.6 50 10 120   Basal: 14.45   DIABETES PLAN  Rapid Acting Insulin (Novolog/FiASP (Aspart) and Humalog/Lyumjev (Lispro))  **Given for Food/Carbohydrates and High Sugar/Glucose**   DAYTIME (breakfast, lunch, dinner) Target Blood Glucose 125 mg/dL Insulin Sensitivity Factor 50 Insulin to Carb Ratio  1 unit for 10 grams   Correction DOSE Food DOSE  (Glucose -Target)/Insulin Sensitivity Factor  Glucose (mg/dL) Units of Rapid Acting Insulin  Less than 125 0  126-175 1  176-225 2  226-275 3  276-325 4  326-375 5  376-425 6  426-475 7  476-525 8  526-575 9  576 or more 10   Number of carbohydrates divided by carb ratio  Number of Carbs Units of Rapid Acting Insulin  0-9 0  10-19 1  20-29 2  30-39 3  40-49 4  50-59 5  60-69 6  70-79 7  80-89 8  90-99 9  100-109 10  110-119 11  120-129 12  130-139 13  140-149 14  150-159 15   160+  (# carbs divided by 10)                 **Correction Dose + Food Dose = Number of units of rapid acting insulin **  Correction for High Sugar/Glucose Food/Carbohydrate  Measure Blood Glucose BEFORE you eat. (Fingerstick with Glucose Meter or check the reading on your Continuous Glucose Meter).  Use the table above or calculate the dose using the formula.  Add this dose to the Food/Carbohydrate dose if eating a meal.  Correction should not be given sooner than every 3 hours since the last dose of rapid acting insulin. 1. Count the number of carbohydrates you will be eating.  2. Use the table above or calculate the dose using the formula.  3. Add this dose to the Correction dose if glucose is above target.         BEDTIME Target Blood Glucose 200 mg/dL Insulin Sensitivity Factor 50 Insulin to Carb Ratio  1 unit for 10 grams   Wait at least 3 hours after taking dinner dose of insulin BEFORE checking bedtime glucose.   Blood Sugar Less Than  125mg /dL? Blood Sugar Between 126 - 199mg /dL?  Blood Sugar Greater Than 200mg /dL?  You MUST EAT 10-15 carbs  1. Carb snack not needed  Carb snack not needed    2. Additional, Optional Carb Snack?  If you want more carbs, you CAN eat them now! Make sure to subtract MUST EAT carbs from total carbs then look at chart below to determine food dose. 2. Optional Carb Snack?   You CAN eat this! Make sure to add up total carbs then look at chart below to determine food dose. 2. Optional Carb Snack?   You CAN eat this! Make sure to add up total carbs then look at chart below to determine food dose.  3. Correction Dose of Insulin?  NO  3. Correction Dose of Insulin?  NO 3. Correction Dose of Insulin?  YES; please look at correction dose chart to determine correction dose.   Glucose (mg/dL) Units of Rapid Acting Insulin  Less than 200 0  201-250 1  251-300 2  301-350 3  351-400 4  401-450 5  451-500 6  501-550 7  551 or more 8    Number of Carbs Units of Rapid Acting Insulin  0-9 0  10-19 1  20-29 2  30-39 3  40-49 4  50-59 5  60-69 6  70-79 7  80-89 8  90-99 9  100-109 10  110-119 11  120-129 12  130-139 13  140-149 14  150-159 15  160+  (# carbs divided by 10)          Long Acting Insulin (Glargine (Basaglar/Lantus/Semglee)/Levemir/Tresiba)  **Remember long acting insulin must be given EVERY DAY, and NEVER skip this dose**                                    Give 15 units at 10pm    If you have any questions/concerns PLEASE call [phone] to speak to the on-call  Pediatric Endocrinology provider at [facility].  [name], MD  Medications:  Continue  as currently prescribed  Please allow 3 days for prescription refill requests! After hours are for emergencies only.  Check Blood Glucose:  Before breakfast, before lunch, before dinner, at bedtime, and for symptoms of high or low blood glucose as a minimum.  Check BG 2 hours after meals if adjusting doses.   Check more frequently on days with more activity than normal.   Check in the middle of the night when evening insulin doses are changed, on days with extra activity in the evening, and if you suspect overnight low glucoses are occurring.   Send a MyChart message as needed for patterns of high or low glucose levels, or multiple low glucoses. As a general rule, ALWAYS call us to review your child's blood glucoses IF: Your child has a seizure You have to use glucagon/Baqsimi/Gvoke or glucose gel to bring up the blood sugar  IF you notice a pattern of high blood sugars  If in a week, your child has: 1 blood glucose that is 40 or less  2 blood glucoses that are 50 or less at the same time of day 3 blood glucoses that are 60 or less at the same time of day  Phone: [phone] Ketones: Check urine or blood ketones, and if blood glucose is greater than 300 mg/dL (injections) or 528 mg/dL (pump), when ill, or if  having symptoms of ketones.  Call if Urine Ketones are moderate or large  Call if Blood Ketones are moderate (1-1.5) or large (more than1.5) Exercise Plan:  Any activity that makes you sweat most days for 60 minutes.  Safety Wear Medical Alert at [facility] Times Citizens requesting the Yellow Dot Packages should contact Airline pilot at the [facility] by calling [phone] or e-mail [email or web address]. TEEN REMINDERS:  Check blood glucose before driving If sexually active, use reliable birth control including condoms.  Alcohol in moderation only - check glucoses more frequently, & have a snack with no carb coverage. Glucose gel/cake icing for low glucose. Check glucoses in the middle of the night. Education:Please refer to your diabetes education book. A copy can be found here: [email or web address] Other: Schedule an eye exam yearly and a dental exam.  Recommend dental cleaning every 6 months. Get a flu vaccine yearly, and Covid-19 vaccine yearly unless contraindicated. Rotate injections sites and avoid any hard lumps (lipohypertrophy)

## 2022-11-26 NOTE — Assessment & Plan Note (Signed)
Diabetes mellitus Type I, under good control. The HbA1c is below goal of 7% or lower and TIR is above goal of over 70%.  Glycemic control improved with her putting on pump and taking insulin. She is in a better place emotionally and ready to transition to adult endo.   When a patient is on insulin, intensive monitoring of blood glucose levels and continuous insulin titration is vital to avoid hyperglycemia and hypoglycemia. Severe hypoglycemia can lead to seizure or death. Hyperglycemia can lead to ketosis requiring ICU admission and intravenous insulin.   Discussed foot care. Annual studies due October 2024 provided printed educational material

## 2022-11-29 ENCOUNTER — Other Ambulatory Visit (INDEPENDENT_AMBULATORY_CARE_PROVIDER_SITE_OTHER): Payer: Self-pay | Admitting: Pediatrics

## 2022-11-29 DIAGNOSIS — Z978 Presence of other specified devices: Secondary | ICD-10-CM

## 2022-11-29 DIAGNOSIS — E109 Type 1 diabetes mellitus without complications: Secondary | ICD-10-CM

## 2022-11-29 DIAGNOSIS — Z9641 Presence of insulin pump (external) (internal): Secondary | ICD-10-CM

## 2022-12-19 ENCOUNTER — Encounter (INDEPENDENT_AMBULATORY_CARE_PROVIDER_SITE_OTHER): Payer: Self-pay

## 2023-01-01 ENCOUNTER — Encounter (INDEPENDENT_AMBULATORY_CARE_PROVIDER_SITE_OTHER): Payer: Self-pay

## 2023-03-11 ENCOUNTER — Encounter (INDEPENDENT_AMBULATORY_CARE_PROVIDER_SITE_OTHER): Payer: Self-pay | Admitting: Pediatrics

## 2023-03-11 DIAGNOSIS — Z978 Presence of other specified devices: Secondary | ICD-10-CM

## 2023-03-11 DIAGNOSIS — E109 Type 1 diabetes mellitus without complications: Secondary | ICD-10-CM

## 2023-03-11 DIAGNOSIS — Z9641 Presence of insulin pump (external) (internal): Secondary | ICD-10-CM

## 2023-03-12 MED ORDER — FIASP 100 UNIT/ML IJ SOLN
INTRAMUSCULAR | 5 refills | Status: AC
Start: 2023-03-12 — End: ?

## 2023-03-13 ENCOUNTER — Ambulatory Visit (INDEPENDENT_AMBULATORY_CARE_PROVIDER_SITE_OTHER): Payer: 59 | Admitting: Family Medicine

## 2023-03-13 ENCOUNTER — Encounter: Payer: Self-pay | Admitting: Family Medicine

## 2023-03-13 VITALS — BP 100/64 | HR 94 | Temp 98.9°F | Ht 69.0 in | Wt 125.0 lb

## 2023-03-13 DIAGNOSIS — J45990 Exercise induced bronchospasm: Secondary | ICD-10-CM | POA: Diagnosis not present

## 2023-03-13 DIAGNOSIS — E109 Type 1 diabetes mellitus without complications: Secondary | ICD-10-CM | POA: Diagnosis not present

## 2023-03-13 DIAGNOSIS — Z7689 Persons encountering health services in other specified circumstances: Secondary | ICD-10-CM

## 2023-03-13 NOTE — Progress Notes (Signed)
Established Patient Office Visit   Subjective  Patient ID: Diane [name], female    DOB: 02-27-2001  Age: 22 y.o. MRN: [id number]  Chief Complaint  Patient presents with   New Patient (Initial Visit)    Patient is a 22 year old female seen for follow-up on chronic conditions and establish care.  Patient previously seen by [name]. MD.  Asthma: Exercise-induced.  Patient taking inhaler prior to activity.  DM 1: Followed by endocrinology.  Using Omni pod insulin pump.  States pump is not communicating with app.  Recently started.  May occur every 4 hours and waste insulin.  Patient unsure how to resolve issue.  Has appointment with new endocrinologist later today.  Menses: Patient states menses now 3 days since being on insulin.  No issues with painful periods.  Allergies: NKDA  Social history: Patient is single.  She is in her second year at [employer] studying physical therapy.  Patient plans to transfer to [employer] in the fall.  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  Patient denies alcohol, tobacco, drug use.    Past Medical History:  Diagnosis Date   Asthma    Controlled diabetes mellitus type 1 without complications (HCC) 03/12/2020   Insulin pump in place 07/10/2021   Uses self-applied continuous glucose monitoring device 11/15/2021   Past Surgical History:  Procedure Laterality Date   CYSTECTOMY     Social History   Tobacco Use   Smoking status: Never    Passive exposure: Never   Smokeless tobacco: Never  Vaping Use   Vaping status: Never Used  Substance Use Topics   Alcohol use: Never   Drug use: Never   Family History  Problem Relation Age of Onset   Asthma Sister    Thyroid disease Maternal Aunt    Thyroid disease Maternal Grandmother    Anemia Maternal Grandmother    Asthma Maternal Grandfather    Diabetes Paternal Grandmother    No Known Allergies    ROS Negative unless stated above    Objective:     BP 100/64 (BP Location: Right  Arm, Patient Position: Sitting, Cuff Size: Normal)   Pulse 94   Temp 98.9 F (37.2 C) (Oral)   Ht 5\' 9"  (1.753 m)   Wt 125 lb (56.7 kg)   LMP 02/21/2023 (Exact Date)   SpO2 98%   BMI 18.46 kg/m  BP Readings from Last 3 Encounters:  03/13/23 100/64  11/26/22 116/74  09/26/22 112/68   Wt Readings from Last 3 Encounters:  03/13/23 125 lb (56.7 kg) (44%, Z= -0.16)*  11/26/22 124 lb 5.4 oz (56.4 kg) (43%, Z= -0.17)*  09/26/22 123 lb 3.2 oz (55.9 kg) (42%, Z= -0.21)*   * Growth percentiles are based on CDC (Girls, 2-20 Years) data.      Physical Exam Constitutional:      General: She is not in acute distress.    Appearance: Normal appearance.  HENT:     Head: Normocephalic and atraumatic.     Nose: Nose normal.     Mouth/Throat:     Mouth: Mucous membranes are moist.  Cardiovascular:     Rate and Rhythm: Normal rate and regular rhythm.     Heart sounds: Normal heart sounds. No murmur heard.    No gallop.  Pulmonary:     Effort: Pulmonary effort is normal. No respiratory distress.     Breath sounds: Normal breath sounds. No wheezing, rhonchi or rales.  Skin:    General:  Skin is warm and dry.  Neurological:     Mental Status: She is alert and oriented to person, place, and time.        03/13/2023    8:11 AM 08/18/2022    2:48 PM  Depression screen PHQ 2/9  Decreased Interest 0 0  Down, Depressed, Hopeless 0 0  PHQ - 2 Score 0 0  Altered sleeping 2 2  Tired, decreased energy 1 1  Change in appetite 0 1  Feeling bad or failure about yourself  0 0  Trouble concentrating 0 1  Moving slowly or fidgety/restless 0 0  Suicidal thoughts 0   PHQ-9 Score 3 5  Difficult doing work/chores Somewhat difficult       03/13/2023    8:11 AM 08/18/2022    2:48 PM  GAD 7 : Generalized Anxiety Score  Nervous, Anxious, on Edge 2 2  Control/stop worrying 0 0  Worry too much - different things 1 1  Trouble relaxing 0 0  Restless 3 3  Easily annoyed or irritable 1 1  Afraid -  awful might happen 0 3  Total GAD 7 Score 7 10  Anxiety Difficulty Somewhat difficult      No results found for any visits on 03/13/23.    Assessment & Plan:  Exercise-induced asthma -Well-controlled -Albuterol inhaler as needed  Controlled diabetes mellitus type 1 without complications (HCC) -Controlled -Hemoglobin A1c 6.4% on 09/26/2022 -Continue OmniPod and Dexcom -Continue follow-up with endocrinology. -Yearly eye exams encouraged.  Encounter to establish care -We reviewed the PMH, PSH, FH, SH, Meds and Allergies. -We provided refills for any medications we will prescribe as needed. -We addressed current concerns per orders and patient instructions. -We have asked for records for pertinent exams, studies, vaccines and notes from previous providers. -We have advised patient to follow up per instructions below.    Return if symptoms worsen or fail to improve.   [name], MD

## 2023-03-30 NOTE — Progress Notes (Unsigned)
Pediatric Endocrinology Diabetes Consultation Follow-up Visit Diane [name] 2001/03/27 [id number] Diane Saint, MD  HPI: Diane [name]  is a 22 y.o. female presenting for follow-up of Type 1 Diabetes. she is accompanied to this visit by her family.Interpreter present throughout the visit: No.  Since last visit on 11/26/2022, she has been well.  There have been no ER visits or hospitalizations. She has been off pump for 2 months. She has not been able to get FiASP vials.   Insulin regimen:  Glargine (Lantus/Basaglar/Semglee) U100  8 units at 10PM Bolus Insulin: FiASP: Insulin Increments: Whole Unit (1) Time Carb Ratio ISF/CF Target (mg/dL)  Breakfast Carb Ratio: 10 ISF/CF: 50 Daytime Target: 125  Lunch Carb Ratio: 10 ISF/CF: 50 Daytime Target: 125  Snack Carb Ratio: 10 ISF/CF: 50 Daytime Target: 125  Dinner Carb Ratio: 10 ISF/CF: 50 Daytime Target: 125  Bedtime Carb Ratio: 10 ISF/CF: 50 Night Target: 200 mg/dL  Other diabetes medication(s): No Hypoglycemia: can feel most low blood sugars.  No glucagon needed recently.  CGM download: Dexcom G6  Med-alert ID: is not currently wearing, but on phone. Injection/Pump sites: upper extremity and lower extremity Health maintenance: Annual studies completed September 2024 with new adult endo provider. Diabetes Health Maintenance Due  Topic Date Due   FOOT EXAM  07/19/2023   OPHTHALMOLOGY EXAM  08/02/2023   HEMOGLOBIN A1C  09/29/2023    ROS: Greater than 10 systems reviewed with pertinent positives listed in HPI, otherwise neg. The following portions of the patient's history were reviewed and updated as appropriate:  Past Medical History:  has a past medical history of Asthma, Controlled diabetes mellitus type 1 without complications (HCC) (03/12/2020), Insulin pump in place (07/10/2021), and Uses self-applied continuous glucose monitoring device (11/15/2021).  Medications:  Outpatient Encounter Medications as of 03/31/2023  Medication Sig    acetone, urine, test strip Check ketones per protocol   albuterol (PROVENTIL HFA;VENTOLIN HFA) 108 (90 Base) MCG/ACT inhaler Inhale 2 puffs into the lungs every 4 (four) hours as needed for wheezing or shortness of breath.   Alcohol Swabs (ALCOHOL PADS) 70 % PADS Use as directed 6x/day   cetirizine (ZYRTEC) 10 MG tablet Take 10 mg by mouth daily as needed for allergies.   Continuous Glucose Sensor (DEXCOM G6 SENSOR) MISC Inject 1 applicator into the skin as directed. (change sensor every 10 days)   Continuous Glucose Transmitter (DEXCOM G6 TRANSMITTER) MISC Inject 1 Device into the skin as directed. (re-use up to 8x with each new sensor)   Accu-Chek FastClix Lancets MISC Check sugar up to 6 times daily. For use with FAST CLIX Lancet Device   Blood Glucose Monitoring Suppl (ACCU-CHEK GUIDE) w/Device KIT 1 each by Does not apply route as directed.   clotrimazole (LOTRIMIN) 1 % cream Apply 1 Application topically 2 (two) times daily. (Patient not taking: Reported on 03/31/2023)   ferrous sulfate 325 (65 FE) MG tablet Take 1 tablet (325 mg total) by mouth daily. (Patient not taking: Reported on 07/10/2021)   Glucagon (BAQSIMI TWO PACK) 3 MG/DOSE POWD Place 1 each into the nose as needed (severe hypoglycmia with unresponsiveness). (Patient not taking: Reported on 03/31/2023)   glucose blood (ACCU-CHEK GUIDE) test strip Use as instructed for 6 checks per day plus per protocol for hyper/hypoglycemia   hydrocortisone 1 % ointment Apply 1 application. topically 2 (two) times daily.   insulin aspart (FIASP FLEXTOUCH) 100 UNIT/ML FlexTouch Pen Inject up to 50 units subcutaneously daily as instructed.   Insulin Aspart, w/Niacinamide, (  FIASP) 100 UNIT/ML SOLN Inject up to 200 units every 2-3 days. Please fill for [employer].   Insulin Disposable Pump (OMNIPOD 5 G6 PODS, GEN 5,) MISC Inject 1 Device into the skin as directed. Change pod every 2 days. Patient will need 3 boxes (each contain 5 pods) for a 30 day  supply. Please fill for [facility] [id number].   insulin glargine (LANTUS SOLOSTAR) 100 UNIT/ML Solostar Pen Up to 50 units per day as directed by MD, in case of pump failure. (Patient not taking: Reported on 03/13/2023)   Insulin Lispro-aabc (LYUMJEV KWIKPEN) 100 UNIT/ML KwikPen Inject up to 50 units subcutaneously daily as instructed. (Patient not taking: Reported on 03/13/2023)   Insulin Pen Needle (INSUPEN PEN NEEDLES) 32G X 4 MM MISC BD Pen Needles- brand specific. Inject insulin via insulin pen 6 x daily   Lancets Misc. (ACCU-CHEK FASTCLIX LANCET) KIT Check sugar 6 times daily   ondansetron (ZOFRAN-ODT) 4 MG disintegrating tablet Take 1 tablet (4 mg total) by mouth every 8 (eight) hours as needed for nausea or vomiting. (Patient not taking: Reported on 03/31/2023)   selenium sulfide (SELSUN) 2.5 % shampoo Apply topically. (Patient not taking: Reported on 11/26/2022)   [DISCONTINUED] insulin aspart (FIASP FLEXTOUCH) 100 UNIT/ML FlexTouch Pen Inject up to 50 units subcutaneously daily as instructed.   [DISCONTINUED] Insulin Disposable Pump (OMNIPOD 5 G6 PODS, GEN 5,) MISC Inject 1 Device into the skin as directed. Change pod every 2 days. Patient will need 3 boxes (each contain 5 pods) for a 30 day supply. Please fill for [facility] [address]21.   No facility-administered encounter medications on file as of 03/31/2023.   Allergies: No Known Allergies Surgical History:  Past Surgical History:  Procedure Laterality Date   CYSTECTOMY     Family History: family history includes Anemia in her maternal grandmother; Asthma in her maternal grandfather and sister; Diabetes in her paternal grandmother; Thyroid disease in her maternal aunt and maternal grandmother.  Social History: Social History   Social History Narrative   Lives with parents and 4 siblings   Attends [employer] for physical therapy.     Physical Exam:  Vitals:   03/31/23 1445  BP: 114/72  Pulse: 86  SpO2: 99%  Weight: 124 lb (56.2 kg)   Height: 5' 8.23" (1.733 m)   BP 114/72   Pulse 86   Ht 5' 8.23" (1.733 m)   Wt 124 lb (56.2 kg)   LMP 03/23/2023 (Exact Date)   SpO2 99%   BMI 18.73 kg/m  Body mass index: body mass index is 18.73 kg/m. Growth %ile SmartLinks can only be used for patients less than 47 years old. Facility age limit for growth %iles is 20 years.   Ht Readings from Last 3 Encounters:  03/31/23 5' 8.23" (1.733 m) (94%, Z= 1.54)*  03/13/23 5\' 9"  (1.753 m) (97%, Z= 1.85)*  11/26/22 5' 8.23" (1.733 m) (94%, Z= 1.55)*   * Growth percentiles are based on CDC (Girls, 2-20 Years) data.   Wt Readings from Last 3 Encounters:  03/31/23 124 lb (56.2 kg) (42%, Z= -0.21)*  03/13/23 125 lb (56.7 kg) (44%, Z= -0.16)*  11/26/22 124 lb 5.4 oz (56.4 kg) (43%, Z= -0.17)*   * Growth percentiles are based on CDC (Girls, 2-20 Years) data.    Physical Exam Vitals reviewed.  Constitutional:      Appearance: Normal appearance. She is not toxic-appearing.  HENT:     Head: Normocephalic and atraumatic.     Nose: Nose normal.  Mouth/Throat:     Mouth: Mucous membranes are moist.  Eyes:     Extraocular Movements: Extraocular movements intact.  Neck:     Comments: No goiter Pulmonary:     Effort: Pulmonary effort is normal. No respiratory distress.  Abdominal:     General: There is no distension.  Musculoskeletal:     Cervical back: Normal range of motion and neck supple.  Skin:    General: Skin is warm.     Comments: No lipohypertrophy  Neurological:     General: No focal deficit present.     Mental Status: She is alert.     Gait: Gait normal.  Psychiatric:        Mood and Affect: Mood normal.        Behavior: Behavior normal.      Labs: Lab Results  Component Value Date   ISLETAB Negative 03/10/2020  ,  Lab Results  Component Value Date   INSULINAB <5.0 03/10/2020  ,  Lab Results  Component Value Date   GLUTAMICACAB 1,417.2 (H) 03/10/2020  , No results found for: "ZNT8AB" No results  found for: "LABIA2"  Lab Results  Component Value Date   CPEPTIDE 0.6 (L) 03/10/2020   Last hemoglobin A1c:  Lab Results  Component Value Date   HGBA1C 7.3 (A) 03/31/2023   Results for orders placed or performed in visit on 03/31/23  POCT Glucose (Device for Home Use)  Result Value Ref Range   Glucose Fasting, POC     POC Glucose 246 (A) 70 - 99 mg/dl  POCT glycosylated hemoglobin (Hb A1C)  Result Value Ref Range   Hemoglobin A1C 7.3 (A) 4.0 - 5.6 %   HbA1c POC (<> result, manual entry)     HbA1c, POC (prediabetic range)     HbA1c, POC (controlled diabetic range)     Lab Results  Component Value Date   HGBA1C 7.3 (A) 03/31/2023   HGBA1C 6.4 (A) 09/26/2022   HGBA1C >14 06/27/2022   Lab Results  Component Value Date   MICROALBUR 0.2 03/17/2022   LDLCALC 118 (H) 03/17/2022   CREATININE 0.85 04/08/2021   Lab Results  Component Value Date   TSH 1.66 03/17/2022   FREE T4 1.2 03/17/2022    Assessment/Plan: Diane [name] was seen today for diabetes.  Uncontrolled type 1 diabetes mellitus with hyperglycemia (HCC) Overview: Type 1 Diabetes diagnosed 03/11/2020 with new onset diabetes after being seen by PCP one day prior to abdominal pain and sore throat.   In the ED she was found to have a blood glucose of 550 with BHB >8. Her pH was 7.01 with bicarb <7. She was admitted to the PICU for insulin GGT. She was started on Lantus and Novolog MDI and received diabetes education prior to discharge. 03/10/2020- Insulin ab <5, ICA neg, GAD 1,417.2. c.peptide 0.6. Omnipod 5 was  started 02/20/2021. She has contact dermatitis with Omnipod adhesive improved with flonase. She has nocturnal hypoglycemia with menses. Glycemic control improved with IBH.   Assessment & Plan: Diabetes mellitus Type I, under fair control. The HbA1c is above goal of 7% or lower and TIR is at goal of over 70%.  She is doing well and education provided on using insulin pen as a vial. She has established with an adult  provider, and will follow up with them. She was encouraged to volunteer at diabetes camp.  When a patient is on insulin, intensive monitoring of blood glucose levels and continuous insulin titration is vital to avoid hyperglycemia  and hypoglycemia. Severe hypoglycemia can lead to seizure or death. Hyperglycemia can lead to ketosis requiring ICU admission and intravenous insulin.   Medications: continued Insulin: See patient instructions/AVS below, Education: as above, and Provided Printed Education Material/has MyChart Access   Orders: -     COLLECTION CAPILLARY BLOOD SPECIMEN -     POCT Glucose (Device for Home Use) -     POCT glycosylated hemoglobin (Hb A1C) -     Omnipod 5 G6 Pods (Gen 5); Inject 1 Device into the skin as directed. Change pod every 2 days. Patient will need 3 boxes (each contain 5 pods) for a 30 day supply. Please fill for [facility] [address]21.  Dispense: 15 each; Refill: 5 -     Fiasp FlexTouch; Inject up to 50 units subcutaneously daily as instructed.  Dispense: 15 mL; Refill: 5 -     Alcohol Pads; Use as directed 6x/day  Dispense: 200 each; Refill: 6  Uses self-applied continuous glucose monitoring device -     Omnipod 5 G6 Pods (Gen 5); Inject 1 Device into the skin as directed. Change pod every 2 days. Patient will need 3 boxes (each contain 5 pods) for a 30 day supply. Please fill for [facility] [address]21.  Dispense: 15 each; Refill: 5 -     Fiasp FlexTouch; Inject up to 50 units subcutaneously daily as instructed.  Dispense: 15 mL; Refill: 5 -     Alcohol Pads; Use as directed 6x/day  Dispense: 200 each; Refill: 6  Insulin pump in place    Patient Instructions  DISCHARGE INSTRUCTIONS FOR Diane [name]  03/31/2023 HbA1c Goals: Our ultimate goal is to achieve the lowest possible HbA1c while avoiding recurrent severe hypoglycemia.  However, all HbA1c goals must be individualized per the American Diabetes Association Clinical Standards. My Hemoglobin A1c History:  Lab  Results  Component Value Date   HGBA1C 7.3 (A) 03/31/2023   HGBA1C 6.4 (A) 09/26/2022   HGBA1C >14 06/27/2022   HGBA1C 8.7 (A) 03/17/2022   HGBA1C 5.8 (A) 07/10/2021   HGBA1C 5.8 (A) 04/08/2021   HGBA1C >15.5 (H) 03/10/2020   HGBA1C >15.5 (H) 03/10/2020   My goal HbA1c is: < 7 %  This is equivalent to an average blood glucose of:  HbA1c % = Average BG  5  97 (78-120)__ 6  126 (100-152)  7  154 (123-185) 8  183 (147-217)  9  212 (170-249)  10  240 (193-282)  11  269 (217-314)  12  298 (240-347)  13  330    Time in Range (TIR) Goals: Target Range over 70% of the time and Very Low less than 4% of the time.  Insulin:  Time Basal Rate (U/hr) ISF/CF Carb Ratio Target (mg/dL)  16XW 0.6 50 10 960        9AM 0.65 50 10 120  4PM 0.6 50 10 120   Basal: 14.45   DIABETES PLAN  Rapid Acting Insulin (Novolog/FiASP (Aspart) and Humalog/Lyumjev (Lispro))  **Given for Food/Carbohydrates and High Sugar/Glucose**   DAYTIME (breakfast, lunch, dinner) Target Blood Glucose 125 mg/dL Insulin Sensitivity Factor 50 Insulin to Carb Ratio  1 unit for 10 grams   Correction DOSE Food DOSE  (Glucose -Target)/Insulin Sensitivity Factor  Glucose (mg/dL) Units of Rapid Acting Insulin  Less than 125 0  126-175 1  176-225 2  226-275 3  276-325 4  326-375 5  376-425 6  426-475 7  476-525 8  526-575 9  576 or more 10  Number of carbohydrates divided by carb ratio  Number of Carbs Units of Rapid Acting Insulin  0-9 0  10-19 1  20-29 2  30-39 3  40-49 4  50-59 5  60-69 6  70-79 7  80-89 8  90-99 9  100-109 10  110-119 11  120-129 12  130-139 13  140-149 14  150-159 15  160+  (# carbs divided by 10)                 **Correction Dose + Food Dose = Number of units of rapid acting insulin **  Correction for High Sugar/Glucose Food/Carbohydrate  Measure Blood Glucose BEFORE you eat. (Fingerstick with Glucose Meter or check the reading on your Continuous Glucose Meter).  Use  the table above or calculate the dose using the formula.  Add this dose to the Food/Carbohydrate dose if eating a meal.  Correction should not be given sooner than every 3 hours since the last dose of rapid acting insulin. 1. Count the number of carbohydrates you will be eating.  2. Use the table above or calculate the dose using the formula.  3. Add this dose to the Correction dose if glucose is above target.         BEDTIME Target Blood Glucose 200 mg/dL Insulin Sensitivity Factor 50 Insulin to Carb Ratio  1 unit for 10 grams   Wait at least 3 hours after taking dinner dose of insulin BEFORE checking bedtime glucose.   Blood Sugar Less Than  125mg /dL? Blood Sugar Between 126 - 199mg /dL? Blood Sugar Greater Than 200mg /dL?  You MUST EAT 10-15 carbs  1. Carb snack not needed  Carb snack not needed    2. Additional, Optional Carb Snack?  If you want more carbs, you CAN eat them now! Make sure to subtract MUST EAT carbs from total carbs then look at chart below to determine food dose. 2. Optional Carb Snack?   You CAN eat this! Make sure to add up total carbs then look at chart below to determine food dose. 2. Optional Carb Snack?   You CAN eat this! Make sure to add up total carbs then look at chart below to determine food dose.  3. Correction Dose of Insulin?  NO  3. Correction Dose of Insulin?  NO 3. Correction Dose of Insulin?  YES; please look at correction dose chart to determine correction dose.   Glucose (mg/dL) Units of Rapid Acting Insulin  Less than 200 0  201-250 1  251-300 2  301-350 3  351-400 4  401-450 5  451-500 6  501-550 7  551 or more 8   Number of Carbs Units of Rapid Acting Insulin  0-9 0  10-19 1  20-29 2  30-39 3  40-49 4  50-59 5  60-69 6  70-79 7  80-89 8  90-99 9  100-109 10  110-119 11  120-129 12  130-139 13  140-149 14  150-159 15  160+  (# carbs divided by 10)          Long Acting Insulin (Glargine  (Basaglar/Lantus/Semglee)/Levemir/Tresiba)  **Remember long acting insulin must be given EVERY DAY, and NEVER skip this dose**                                    Give 8 units at 10pm    If you have any questions/concerns PLEASE call [phone]  to speak to the on-call  Pediatric Endocrinology provider at [facility].  [name], MD  Medications:  Continue as currently prescribed  Please allow 3 days for prescription refill requests! After hours are for emergencies only.  Check Blood Glucose:  Before breakfast, before lunch, before dinner, at bedtime, and for symptoms of high or low blood glucose as a minimum.  Check BG 2 hours after meals if adjusting doses.   Check more frequently on days with more activity than normal.   Check in the middle of the night when evening insulin doses are changed, on days with extra activity in the evening, and if you suspect overnight low glucoses are occurring.   Send a MyChart message as needed for patterns of high or low glucose levels, or multiple low glucoses. As a general rule, ALWAYS call us to review your child's blood glucoses IF: Your child has a seizure You have to use glucagon/Baqsimi/Gvoke or glucose gel to bring up the blood sugar  IF you notice a pattern of high blood sugars  If in a week, your child has: 1 blood glucose that is 40 or less  2 blood glucoses that are 50 or less at the same time of day 3 blood glucoses that are 60 or less at the same time of day  Phone: [phone] Ketones: Check urine or blood ketones, and if blood glucose is greater than 300 mg/dL (injections) or 098 mg/dL (pump), when ill, or if having symptoms of ketones.  Call if Urine Ketones are moderate or large Call if Blood Ketones are moderate (1-1.5) or large (more than1.5) Exercise Plan:  Any activity that makes you sweat most days for 60 minutes.  Safety Wear Medical Alert at [facility] Times Citizens requesting the Yellow Dot Packages  should contact Airline pilot at the [employer] by calling [phone] or e-mail [email or web address]. TEEN REMINDERS:  Check blood glucose before driving If sexually active, use reliable birth control including condoms.  Alcohol in moderation only - check glucoses more frequently, & have a snack with no carb coverage. Glucose gel/cake icing for low glucose. Check glucoses in the middle of the night. Education:Please refer to your diabetes education book. A copy can be found here: [email or web address] Other: Schedule an eye exam yearly and a dental exam.  Recommend dental cleaning every 6 months. Get a flu vaccine yearly, and Covid-19 vaccine yearly unless contraindicated. Rotate injections sites and avoid any hard lumps (lipohypertrophy)   Follow-up:   Return if symptoms worsen or fail to improve.   Medical decision-making:  I have personally spent 45 minutes involved in face-to-face and non-face-to-face activities for this patient on the day of the visit. Professional time spent includes the following activities, in addition to those noted in the documentation: preparation time/chart review, ordering of medications/tests/procedures, obtaining and/or reviewing separately obtained history, counseling and educating the patient/family/caregiver, performing a medically appropriate examination and/or evaluation, referring and communicating with other health care professionals for care coordination, and documentation in the EHR.  Thank you for the opportunity to participate in the care of our mutual patient. Please do not hesitate to contact me should you have any questions regarding the assessment or treatment plan.   Sincerely,   [name], MD

## 2023-03-31 ENCOUNTER — Encounter (INDEPENDENT_AMBULATORY_CARE_PROVIDER_SITE_OTHER): Payer: Self-pay | Admitting: Pediatrics

## 2023-03-31 ENCOUNTER — Ambulatory Visit (INDEPENDENT_AMBULATORY_CARE_PROVIDER_SITE_OTHER): Payer: 59 | Admitting: Pediatrics

## 2023-03-31 VITALS — BP 114/72 | HR 86 | Ht 68.23 in | Wt 124.0 lb

## 2023-03-31 DIAGNOSIS — E1065 Type 1 diabetes mellitus with hyperglycemia: Secondary | ICD-10-CM

## 2023-03-31 DIAGNOSIS — Z9641 Presence of insulin pump (external) (internal): Secondary | ICD-10-CM | POA: Diagnosis not present

## 2023-03-31 DIAGNOSIS — Z978 Presence of other specified devices: Secondary | ICD-10-CM

## 2023-03-31 LAB — POCT GLYCOSYLATED HEMOGLOBIN (HGB A1C): Hemoglobin A1C: 7.3 % — AB (ref 4.0–5.6)

## 2023-03-31 LAB — POCT GLUCOSE (DEVICE FOR HOME USE): POC Glucose: 246 mg/dL — AB (ref 70–99)

## 2023-03-31 MED ORDER — ALCOHOL PADS 70 % PADS
MEDICATED_PAD | 6 refills | Status: AC
Start: 2023-03-31 — End: ?

## 2023-03-31 MED ORDER — FIASP FLEXTOUCH 100 UNIT/ML ~~LOC~~ SOPN
PEN_INJECTOR | SUBCUTANEOUS | 5 refills | Status: AC
Start: 2023-03-31 — End: ?

## 2023-03-31 MED ORDER — OMNIPOD 5 G6 PODS (GEN 5) MISC
1.0000 | 5 refills | Status: AC
Start: 2023-03-31 — End: ?

## 2023-03-31 NOTE — Patient Instructions (Signed)
DISCHARGE INSTRUCTIONS FOR Diane [name]  03/31/2023 HbA1c Goals: Our ultimate goal is to achieve the lowest possible HbA1c while avoiding recurrent severe hypoglycemia.  However, all HbA1c goals must be individualized per the American Diabetes Association Clinical Standards. My Hemoglobin A1c History:  Lab Results  Component Value Date   HGBA1C 7.3 (A) 03/31/2023   HGBA1C 6.4 (A) 09/26/2022   HGBA1C >14 06/27/2022   HGBA1C 8.7 (A) 03/17/2022   HGBA1C 5.8 (A) 07/10/2021   HGBA1C 5.8 (A) 04/08/2021   HGBA1C >15.5 (H) 03/10/2020   HGBA1C >15.5 (H) 03/10/2020   My goal HbA1c is: < 7 %  This is equivalent to an average blood glucose of:  HbA1c % = Average BG  5  97 (78-120)__ 6  126 (100-152)  7  154 (123-185) 8  183 (147-217)  9  212 (170-249)  10  240 (193-282)  11  269 (217-314)  12  298 (240-347)  13  330    Time in Range (TIR) Goals: Target Range over 70% of the time and Very Low less than 4% of the time.  Insulin:  Time Basal Rate (U/hr) ISF/CF Carb Ratio Target (mg/dL)  11BJ 0.6 50 10 478        9AM 0.65 50 10 120  4PM 0.6 50 10 120   Basal: 14.45   DIABETES PLAN  Rapid Acting Insulin (Novolog/FiASP (Aspart) and Humalog/Lyumjev (Lispro))  **Given for Food/Carbohydrates and High Sugar/Glucose**   DAYTIME (breakfast, lunch, dinner) Target Blood Glucose 125 mg/dL Insulin Sensitivity Factor 50 Insulin to Carb Ratio  1 unit for 10 grams   Correction DOSE Food DOSE  (Glucose -Target)/Insulin Sensitivity Factor  Glucose (mg/dL) Units of Rapid Acting Insulin  Less than 125 0  126-175 1  176-225 2  226-275 3  276-325 4  326-375 5  376-425 6  426-475 7  476-525 8  526-575 9  576 or more 10   Number of carbohydrates divided by carb ratio  Number of Carbs Units of Rapid Acting Insulin  0-9 0  10-19 1  20-29 2  30-39 3  40-49 4  50-59 5  60-69 6  70-79 7  80-89 8  90-99 9  100-109 10  110-119 11  120-129 12  130-139 13  140-149 14  150-159 15   160+  (# carbs divided by 10)                 **Correction Dose + Food Dose = Number of units of rapid acting insulin **  Correction for High Sugar/Glucose Food/Carbohydrate  Measure Blood Glucose BEFORE you eat. (Fingerstick with Glucose Meter or check the reading on your Continuous Glucose Meter).  Use the table above or calculate the dose using the formula.  Add this dose to the Food/Carbohydrate dose if eating a meal.  Correction should not be given sooner than every 3 hours since the last dose of rapid acting insulin. 1. Count the number of carbohydrates you will be eating.  2. Use the table above or calculate the dose using the formula.  3. Add this dose to the Correction dose if glucose is above target.         BEDTIME Target Blood Glucose 200 mg/dL Insulin Sensitivity Factor 50 Insulin to Carb Ratio  1 unit for 10 grams   Wait at least 3 hours after taking dinner dose of insulin BEFORE checking bedtime glucose.   Blood Sugar Less Than  125mg /dL? Blood Sugar Between 126 - 199mg /dL?  Blood Sugar Greater Than 200mg /dL?  You MUST EAT 10-15 carbs  1. Carb snack not needed  Carb snack not needed    2. Additional, Optional Carb Snack?  If you want more carbs, you CAN eat them now! Make sure to subtract MUST EAT carbs from total carbs then look at chart below to determine food dose. 2. Optional Carb Snack?   You CAN eat this! Make sure to add up total carbs then look at chart below to determine food dose. 2. Optional Carb Snack?   You CAN eat this! Make sure to add up total carbs then look at chart below to determine food dose.  3. Correction Dose of Insulin?  NO  3. Correction Dose of Insulin?  NO 3. Correction Dose of Insulin?  YES; please look at correction dose chart to determine correction dose.   Glucose (mg/dL) Units of Rapid Acting Insulin  Less than 200 0  201-250 1  251-300 2  301-350 3  351-400 4  401-450 5  451-500 6  501-550 7  551 or more 8    Number of Carbs Units of Rapid Acting Insulin  0-9 0  10-19 1  20-29 2  30-39 3  40-49 4  50-59 5  60-69 6  70-79 7  80-89 8  90-99 9  100-109 10  110-119 11  120-129 12  130-139 13  140-149 14  150-159 15  160+  (# carbs divided by 10)          Long Acting Insulin (Glargine (Basaglar/Lantus/Semglee)/Levemir/Tresiba)  **Remember long acting insulin must be given EVERY DAY, and NEVER skip this dose**                                    Give 8 units at 10pm    If you have any questions/concerns PLEASE call [phone] to speak to the on-call  Pediatric Endocrinology provider at [facility].  [name], MD  Medications:  Continue as currently prescribed  Please allow 3 days for prescription refill requests! After hours are for emergencies only.  Check Blood Glucose:  Before breakfast, before lunch, before dinner, at bedtime, and for symptoms of high or low blood glucose as a minimum.  Check BG 2 hours after meals if adjusting doses.   Check more frequently on days with more activity than normal.   Check in the middle of the night when evening insulin doses are changed, on days with extra activity in the evening, and if you suspect overnight low glucoses are occurring.   Send a MyChart message as needed for patterns of high or low glucose levels, or multiple low glucoses. As a general rule, ALWAYS call us to review your child's blood glucoses IF: Your child has a seizure You have to use glucagon/Baqsimi/Gvoke or glucose gel to bring up the blood sugar  IF you notice a pattern of high blood sugars  If in a week, your child has: 1 blood glucose that is 40 or less  2 blood glucoses that are 50 or less at the same time of day 3 blood glucoses that are 60 or less at the same time of day  Phone: [phone] Ketones: Check urine or blood ketones, and if blood glucose is greater than 300 mg/dL (injections) or 063 mg/dL (pump), when ill, or if having  symptoms of ketones.  Call if Urine Ketones are moderate or large Call  if Blood Ketones are moderate (1-1.5) or large (more than1.5) Exercise Plan:  Any activity that makes you sweat most days for 60 minutes.  Safety Wear Medical Alert at [facility] Citizens requesting the [employer]ages should contact Airline pilot at the [facility] by calling [phone] or e-mail [email or web address]. TEEN REMINDERS:  Check blood glucose before driving If sexually active, use reliable birth control including condoms.  Alcohol in moderation only - check glucoses more frequently, & have a snack with no carb coverage. Glucose gel/cake icing for low glucose. Check glucoses in the middle of the night. Education:Please refer to your diabetes education book. A copy can be found here: [email or web address] Other: Schedule an eye exam yearly and a dental exam.  Recommend dental cleaning every 6 months. Get a flu vaccine yearly, and Covid-19 vaccine yearly unless contraindicated. Rotate injections sites and avoid any hard lumps (lipohypertrophy)

## 2023-03-31 NOTE — Assessment & Plan Note (Signed)
Diabetes mellitus Type I, under fair control. The HbA1c is above goal of 7% or lower and TIR is at goal of over 70%.  She is doing well and education provided on using insulin pen as a vial. She has established with an adult provider, and will follow up with them. She was encouraged to volunteer at diabetes camp.  When a patient is on insulin, intensive monitoring of blood glucose levels and continuous insulin titration is vital to avoid hyperglycemia and hypoglycemia. Severe hypoglycemia can lead to seizure or death. Hyperglycemia can lead to ketosis requiring ICU admission and intravenous insulin.   Medications: continued Insulin: See patient instructions/AVS below, Education: as above, and Provided Armed forces operational officer

## 2023-04-03 ENCOUNTER — Telehealth (INDEPENDENT_AMBULATORY_CARE_PROVIDER_SITE_OTHER): Payer: Self-pay

## 2023-04-03 NOTE — Telephone Encounter (Signed)
Received fax from covermymeds prior authorization is expiring.  Complete prior authorization   

## 2023-04-06 ENCOUNTER — Telehealth (INDEPENDENT_AMBULATORY_CARE_PROVIDER_SITE_OTHER): Payer: Self-pay

## 2023-04-06 NOTE — Telephone Encounter (Signed)
Received fax from pharmacy/covermymeds to complete prior authorization initiated on covermymeds, completed prior authorization      Pharmacy would like notification of determination  F : [id number] P:  [phone]

## 2023-04-28 ENCOUNTER — Encounter (INDEPENDENT_AMBULATORY_CARE_PROVIDER_SITE_OTHER): Payer: Self-pay

## 2023-06-19 ENCOUNTER — Telehealth (INDEPENDENT_AMBULATORY_CARE_PROVIDER_SITE_OTHER): Payer: Self-pay

## 2023-06-19 NOTE — Telephone Encounter (Signed)
 Received fax from pharmacy/covermymeds to complete prior authorization initiated on covermymeds, completed prior authorization      Pharmacy would like notification of determination  [id number] F: [phone]

## 2023-06-29 ENCOUNTER — Telehealth (INDEPENDENT_AMBULATORY_CARE_PROVIDER_SITE_OTHER): Payer: Self-pay

## 2023-06-29 NOTE — Telephone Encounter (Signed)
 Received fax from pharmacy/covermymeds to complete prior authorization initiated on covermymeds, completed prior authorization      Pharmacy would like notification of determination  [id number] F:  [id number]

## 2023-07-21 ENCOUNTER — Other Ambulatory Visit (INDEPENDENT_AMBULATORY_CARE_PROVIDER_SITE_OTHER): Payer: Self-pay

## 2023-07-21 MED ORDER — ACCU-CHEK GUIDE TEST VI STRP: ORAL_STRIP | 1 refills | Status: AC

## 2023-08-31 ENCOUNTER — Other Ambulatory Visit (INDEPENDENT_AMBULATORY_CARE_PROVIDER_SITE_OTHER): Payer: Self-pay

## 2023-08-31 DIAGNOSIS — E109 Type 1 diabetes mellitus without complications: Secondary | ICD-10-CM

## 2023-08-31 DIAGNOSIS — Z9641 Presence of insulin pump (external) (internal): Secondary | ICD-10-CM

## 2023-08-31 DIAGNOSIS — Z978 Presence of other specified devices: Secondary | ICD-10-CM

## 2023-08-31 MED ORDER — DEXCOM G6 SENSOR MISC: 1.0000 | 1 refills | Status: AC

## 2023-09-22 ENCOUNTER — Encounter (INDEPENDENT_AMBULATORY_CARE_PROVIDER_SITE_OTHER): Payer: Self-pay

## 2023-09-24 ENCOUNTER — Other Ambulatory Visit (HOSPITAL_COMMUNITY): Payer: Self-pay

## 2023-10-04 ENCOUNTER — Ambulatory Visit (HOSPITAL_COMMUNITY)
Admission: EM | Admit: 2023-10-04 | Discharge: 2023-10-04 | Disposition: A | Discharge status: Home / Self Care | Attending: Emergency Medicine | Admitting: Emergency Medicine

## 2023-10-04 ENCOUNTER — Encounter (HOSPITAL_COMMUNITY): Payer: Self-pay | Admitting: Emergency Medicine

## 2023-10-04 DIAGNOSIS — U071 COVID-19: Secondary | ICD-10-CM

## 2023-10-04 LAB — POC SARS CORONAVIRUS 2 AG -  ED: SARS Coronavirus 2 Ag: POSITIVE — AB

## 2023-10-04 MED ORDER — FLUTICASONE PROPIONATE 50 MCG/ACT NA SUSP: 2.0000 | Freq: Every day | NASAL | 0 refills | Status: AC

## 2023-10-04 NOTE — ED Triage Notes (Signed)
 Pt c/o cough, sore throat, congestion for week. Took allergy medication but didn't help.

## 2023-10-04 NOTE — Discharge Instructions (Signed)
 Try the nasal spray for your congestion in addition to the zyrtec. Wear a mask when around others while you are feeling sick. Treat symptoms like you would with a cold- for example, if you have a headache, take tylenol , if you have a lot of congestion, try mucinex.

## 2023-10-04 NOTE — ED Provider Notes (Signed)
 [facility]    CSN: [id number] Arrival date & time: 10/04/23  1302      History   Chief Complaint Chief Complaint  Patient presents with   Cough   Sore Throat    HPI Diane [name] is a 23 y.o. female. Has been congested, headache, sore throat for more than a week now. Mom recently tested positive for covid, pt wants testing. Thought her sx were allergies so was takin zyrtec for little relief.   Blood sugars have been very high lately. Is out of fiasp  - medicaid won't cover anymore? - so is only on basal insulin . She states her endocrinology office is working on getting her insulin .    Cough Sore Throat    Past Medical History:  Diagnosis Date   Asthma    Controlled diabetes mellitus type 1 without complications (HCC) 03/12/2020   Insulin  pump in place 07/10/2021   Uses self-applied continuous glucose monitoring device 11/15/2021    Patient Active Problem List   Diagnosis Date Noted   Tinea pedis of left foot 11/26/2022   Insulin  pump titration 06/27/2022   Counseling for transition from pediatric to adult care provider 06/27/2022   Noncompliance with diabetes treatment 06/27/2022   Depression 06/27/2022   Ketonuria 04/01/2022   Hyperlipidemia LDL goal <100 04/01/2022   Uses self-applied continuous glucose monitoring device 11/15/2021   Insulin  pump in place 07/10/2021   Need for immunization against influenza 04/08/2021   Nocturnal hypoglycemia 12/06/2020   Uncontrolled type 1 diabetes mellitus with hyperglycemia (HCC) 12/05/2020   Hypoglycemia due to type 1 diabetes mellitus (HCC) 04/23/2020   Adjustment reaction to medical therapy 04/23/2020   Controlled diabetes mellitus type 1 without complications (HCC) 03/12/2020   Adjustment disorder with mixed anxiety and depressed mood    Hyperglycemia 03/10/2020   DKA (diabetic ketoacidosis) (HCC) 03/10/2020    Past Surgical History:  Procedure Laterality Date   CYSTECTOMY      OB History   No  obstetric history on file.      Home Medications    Prior to Admission medications   Medication Sig Start Date End Date Taking? Authorizing Provider  fluticasone  (FLONASE ) 50 MCG/ACT nasal spray Place 2 sprays into both nostrils daily. 10/04/23  Yes Blinda Burger, NP  Accu-Chek FastClix Lancets MISC Check sugar up to 6 times daily. For use with FAST CLIX Lancet Device 05/19/22   Maryjo Snipe, MD  acetone, urine, test strip Check ketones per protocol 03/11/20   Ovidio Blower, MD  albuterol  (PROVENTIL  HFA;VENTOLIN  HFA) 108 (90 Base) MCG/ACT inhaler Inhale 2 puffs into the lungs every 4 (four) hours as needed for wheezing or shortness of breath. 01/08/18   Brian Campanile, MD  Alcohol  Swabs (ALCOHOL  PADS) 70 % PADS Use as directed 6x/day 03/31/23   Maryjo Snipe, MD  Blood Glucose Monitoring Suppl (ACCU-CHEK GUIDE) w/Device KIT 1 each by Does not apply route as directed. 03/26/20   Candee Cha, NP  cetirizine (ZYRTEC) 10 MG tablet Take 10 mg by mouth daily as needed for allergies.    [provider]  clotrimazole  (LOTRIMIN ) 1 % cream Apply 1 Application topically 2 (two) times daily. Patient not taking: Reported on 03/31/2023 11/26/22   Maryjo Snipe, MD  Continuous Glucose Sensor (DEXCOM G6 SENSOR) MISC Inject 1 applicator into the skin as directed. (change sensor every 10 days) 08/31/23   Maryjo Snipe, MD  Continuous Glucose Transmitter (DEXCOM G6 TRANSMITTER) MISC Inject 1 Device into the skin as directed. (re-use  up to 8x with each new sensor) 11/26/22   Maryjo Snipe, MD  ferrous sulfate  325 (65 FE) MG tablet Take 1 tablet (325 mg total) by mouth daily. Patient not taking: Reported on 07/10/2021 07/06/20 11/03/20  Candee Cha, NP  Glucagon  (BAQSIMI  TWO PACK) 3 MG/DOSE POWD Place 1 each into the nose as needed (severe hypoglycmia with unresponsiveness). Patient not taking: Reported on 03/31/2023 05/19/22   Maryjo Snipe, MD  glucose blood (ACCU-CHEK GUIDE  TEST) test strip Use as instructed 6x/day 07/21/23   Maryjo Snipe, MD  glucose blood (ACCU-CHEK GUIDE) test strip Use as instructed for 6 checks per day plus per protocol for hyper/hypoglycemia 05/19/22   Maryjo Snipe, MD  hydrocortisone  1 % ointment Apply 1 application. topically 2 (two) times daily. 10/01/21   Candee Cha, NP  insulin  aspart (FIASP  FLEXTOUCH) 100 UNIT/ML FlexTouch Pen Inject up to 50 units subcutaneously daily as instructed. 03/31/23   Maryjo Snipe, MD  Insulin  Aspart, w/Niacinamide, (FIASP ) 100 UNIT/ML SOLN Inject up to 200 units every 2-3 days. Please fill for Fiasp  VIAL. 03/12/23   Maryjo Snipe, MD  Insulin  Disposable Pump (OMNIPOD 5 G6 PODS, GEN 5,) MISC Inject 1 Device into the skin as directed. Change pod every 2 days. Patient will need 3 boxes (each contain 5 pods) for a 30 day supply. Please fill for [facility] [id number]. 03/31/23   Maryjo Snipe, MD  insulin  glargine (LANTUS  SOLOSTAR) 100 UNIT/ML Solostar Pen Up to 50 units per day as directed by MD, in case of pump failure. Patient not taking: Reported on 03/13/2023 11/26/22   Maryjo Snipe, MD  Insulin  Lispro-aabc (LYUMJEV  [facility]) 100 UNIT/ML KwikPen Inject up to 50 units subcutaneously daily as instructed. Patient not taking: Reported on 03/13/2023 06/18/22   Maryjo Snipe, MD  Insulin  Pen Needle (INSUPEN PEN NEEDLES) 32G X 4 MM MISC BD Pen Needles- brand specific. Inject insulin  via insulin  pen 6 x daily 05/19/22   Maryjo Snipe, MD  Lancets Misc. (ACCU-CHEK FASTCLIX LANCET) KIT Check sugar 6 times daily 03/26/20   Candee Cha, NP  ondansetron  (ZOFRAN -ODT) 4 MG disintegrating tablet Take 1 tablet (4 mg total) by mouth every 8 (eight) hours as needed for nausea or vomiting. Patient not taking: Reported on 03/31/2023 11/26/22   Meehan, Colette, MD  selenium sulfide (SELSUN) 2.5 % shampoo Apply topically. Patient not taking: Reported on 11/26/2022 11/01/20   [provider]    Family  History Family History  Problem Relation Age of Onset   Asthma Sister    Thyroid disease Maternal Aunt    Thyroid disease Maternal Grandmother    Anemia Maternal Grandmother    Asthma Maternal Grandfather    Diabetes Paternal Grandmother     Social History Social History   Tobacco Use   Smoking status: Never    Passive exposure: Never   Smokeless tobacco: Never  Vaping Use   Vaping status: Never Used  Substance Use Topics   Alcohol  use: Never   Drug use: Never     Allergies   Patient has no known allergies.   Review of Systems Review of Systems  Respiratory:  Positive for cough.      Physical Exam Triage Vital Signs ED Triage Vitals  Encounter Vitals Group     BP 10/04/23 1335 104/73     Systolic BP Percentile --      Diastolic BP Percentile --      Pulse Rate 10/04/23 1335 86     Resp 10/04/23 1335 15  Temp 10/04/23 1335 (!) 97.5 F (36.4 C)     Temp Source 10/04/23 1335 Oral     SpO2 10/04/23 1335 96 %     Weight --      Height --      Head Circumference --      Peak Flow --      Pain Score 10/04/23 1334 5     Pain Loc --      Pain Education --      Exclude from Growth Chart --    No data found.  Updated Vital Signs BP 104/73 (BP Location: Left Arm)   Pulse 86   Temp (!) 97.5 F (36.4 C) (Oral)   Resp 15   LMP 09/20/2023 (Approximate)   SpO2 96%   Visual Acuity Right Eye Distance:   Left Eye Distance:   Bilateral Distance:    Right Eye Near:   Left Eye Near:    Bilateral Near:     Physical Exam Constitutional:      General: She is not in acute distress.    Appearance: She is well-developed. She is not ill-appearing.  HENT:     Right Ear: Tympanic membrane, ear canal and external ear normal.     Left Ear: Tympanic membrane, ear canal and external ear normal.     Nose: Nose normal.     Mouth/Throat:     Mouth: Mucous membranes are moist.     Pharynx: Oropharynx is clear. No oropharyngeal exudate.  Cardiovascular:     Rate  and Rhythm: Normal rate and regular rhythm.  Pulmonary:     Effort: Pulmonary effort is normal.     Breath sounds: Normal breath sounds.  Neurological:     Mental Status: She is alert.      UC Treatments / Results  Labs (all labs ordered are listed, but only abnormal results are displayed) Labs Reviewed  POC SARS CORONAVIRUS 2 AG -  ED - Abnormal; Notable for the following components:      Result Value   SARS Coronavirus 2 Ag Positive (*)    All other components within normal limits    EKG   Radiology No results found.  Procedures Procedures (including critical care time)  Medications Ordered in UC Medications - No data to display  Initial Impression / Assessment and Plan / UC Course  I have reviewed the triage vital signs and the nursing notes.  Pertinent labs & imaging results that were available during my care of the patient were reviewed by me and considered in my medical decision making (see chart for details).    Positive for covid. As she has had sx for a week or more, I don't think she would benefit from paxlovid. Discussed supportive measures. She will continue to f/u with endo for insulin .   Final Clinical Impressions(s) / UC Diagnoses   Final diagnoses:  COVID-19     Discharge Instructions      Try the nasal spray for your congestion in addition to the zyrtec. Wear a mask when around others while you are feeling sick. Treat symptoms like you would with a cold- for example, if you have a headache, take tylenol , if you have a lot of congestion, try mucinex.    ED Prescriptions     Medication Sig Dispense Auth. Provider   fluticasone  (FLONASE ) 50 MCG/ACT nasal spray Place 2 sprays into both nostrils daily. 16 g Blinda Burger, NP      PDMP not reviewed this  encounter.   Blinda Burger, NP 10/04/23 1408

## 2023-10-05 ENCOUNTER — Encounter (INDEPENDENT_AMBULATORY_CARE_PROVIDER_SITE_OTHER): Payer: Self-pay

## 2024-02-23 ENCOUNTER — Telehealth: Payer: Self-pay | Admitting: Family Medicine

## 2024-02-23 NOTE — Telephone Encounter (Signed)
 Copied from CRM [id number]. Topic: Medical Record Request - Other >> Feb 22, 2024  4:36 PM Diane [name] wrote: Reason for CRM: Patient would like a hard copy of her immunization records signed by Dr. Mercer.   Please call once completed.

## 2024-02-23 NOTE — Telephone Encounter (Signed)
 Spoke with patient, patient has received records from a different provider

## 2024-05-02 ENCOUNTER — Encounter: Attending: Internal Medicine | Admitting: Dietician

## 2024-05-02 ENCOUNTER — Encounter: Payer: Self-pay | Admitting: Dietician

## 2024-05-02 VITALS — Ht 67.5 in | Wt 133.0 lb

## 2024-05-02 DIAGNOSIS — E1065 Type 1 diabetes mellitus with hyperglycemia: Secondary | ICD-10-CM | POA: Diagnosis present

## 2024-05-02 NOTE — Progress Notes (Signed)
 Diabetes Self-Management Education  Visit Type: First/Initial  Appt. Start Time: 1045 Appt. End Time: 1200  05/11/2024  [name], identified by name and date of birth, is a 23 y.o. female with a diagnosis of Diabetes: Type 1.   ASSESSMENT Patient is here today with her mother.  She had last diabetes education in 2021 when newly diagnosed. A1C increased to >16.9% 11/03/2023 due to running out of medication. Discussed how patient can advocate for herself with her insurance company, doctor's office, and pharmacy. Discussed how to build up an emergency supply of insulin  and CGM by picking up prescriptions at the same time each month. Discussed the type 1 support group.  Patient is not interested at this time but mom is and will add mom to the support group email list. She states that her carb counting is a 5/10 and does not bolus for all of her carbs.  Noted that her ICR is very high at 1:120. Changed her ICR from 120 to 100 and patient is to more consistently carb count and bolus for intake.  She is to also bolus before she eats rather than after. Patient shared her Dexcom with me and we reviewed the report together.  She ran out of Dexcom for 2.5 days last week and did not prick her finger.   Encouraged her to take her MVI or Vitamin D as her vitamin D was low in July.  Reviewed her Dexcom Clarity today 05/11/2024 at time of charting.  There are times that her sensor readings are very high.  Question if she is bolusing at those times or has a bad site.  Will discuss more fully at her next appointment on 06/06/2024.  Need to have consistent use before making further pump changes as at times, she is well controlled.  GMI today was 8.6% which has improved from last week.  Referral:  Type 1 Diabetes - poorly controlled on an Omnipod pump - setting adjustments needed.  Type 1 Diabetes (2021 and Omnipod 2022), asthma Medications include:  inulin lispro via pump Labs noted to include:  A1C   11.7% 02/16/2024 decreased from >16.9% on 11/03/2023, vitamin D19.6 on 12/15/2023 Pump:  Omnipod 5 Current settings:  Basal rate 12 AM - 1 PM:  0.5 units/hr         1 PM- 12 AM:  1 unit per hour  ICR 120 >> 100 (changed at visit)  ISF 50  Duration of insulin  action 4  Target 120  Correct above 70  Dexcom CGM - patient shares with mom  CGM Results from download:   % Time CGM active:   81 %   (Goal >70%)  Average glucose:   235 mg/dL for 14 days  Glucose management indicator:   8.9 %  Time in range (70-180 mg/dL):   42 %   (Goal >29%)  Time High (181-250 mg/dL):   24 %   (Goal < 74%)  Time Very High (>250 mg/dL):    34 %   (Goal < 5%)  Time Low (54-69 mg/dL):   0 %   (Goal <5%)  Time Very Low (<54 mg/dL):   0 %   (Goal <8%)  %CV (glucose variability)    42 %  (Goal <36%)   67.5 133 lbs 04/01/2024  Patient is going to [employer] in the study of kinesiology.  Her parents and 4 siblings live locally and she frequently eats with them or out to eat.  She does not eat in the  dorm. She works at [employer] - 1st shift. Height 5' 7.5 (1.715 m), weight 133 lb (60.3 kg). Body mass index is 20.52 kg/m.   Diabetes Self-Management Education - 05/11/24 1217       Visit Information   Visit Type First/Initial      Initial Visit   Diabetes Type Type 1    Date Diagnosed 2021    Are you currently following a meal plan? Yes    What type of meal plan do you follow? counts carbs at times    Are you taking your medications as prescribed? No      Psychosocial Assessment   Patient Belief/Attitude about Diabetes Motivated to manage diabetes    What is the hardest part about your diabetes right now, causing you the most concern, or is the most worrisome to you about your diabetes?   Making healty food and beverage choices;Taking/obtaining medications    Self-care barriers None    Self-management support Doctor's office;Family;CDE visits    Other persons present Patient;Family Member    Patient Concerns  Nutrition/Meal planning;Problem Solving;Glycemic Control;Medication    Special Needs None    Preferred Learning Style No preference indicated    Learning Readiness Ready    How often do you need to have someone help you when you read instructions, pamphlets, or other written materials from your doctor or pharmacy? 1 - Never    What is the last grade level you completed in school? in college now      Pre-Education Assessment   Patient understands the diabetes disease and treatment process. Needs Review    Patient understands incorporating nutritional management into lifestyle. Needs Review    Patient undertands incorporating physical activity into lifestyle. Needs Review    Patient understands using medications safely. Needs Review    Patient understands monitoring blood glucose, interpreting and using results Needs Review    Patient understands prevention, detection, and treatment of acute complications. Needs Review    Patient understands prevention, detection, and treatment of chronic complications. Needs Review    Patient understands how to develop strategies to address psychosocial issues. Needs Review    Patient understands how to develop strategies to promote health/change behavior. Needs Review      Complications   Last HgB A1C per patient/outside source 11.7 %   02/16/2024 decreased from >16.9% 10/2023   How often do you check your blood sugar? > 4 times/day    Number of hyperglycemic episodes ( >200mg /dL): Daily      Activity / Exercise   Activity / Exercise Type Light (walking / raking leaves)      Patient Education   Previous Diabetes Education Yes    Disease Pathophysiology Definition of diabetes, type 1 and 2, and the diagnosis of diabetes    Healthy Eating Role of diet in the treatment of diabetes and the relationship between the three main macronutrients and blood glucose level;Carbohydrate counting    Being Active Role of exercise on diabetes management, blood pressure  control and cardiac health.    Medications Reviewed patients medication for diabetes, action, purpose, timing of dose and side effects.;Taught/reviewed insulin /injectables, injection, site rotation, insulin /injectables storage and needle disposal.    Monitoring Taught/evaluated CGM (comment);Identified appropriate SMBG and/or A1C goals.    Acute complications Taught prevention, symptoms, and  treatment of hypoglycemia - the 15 rule.;Other (comment)   ketones   Chronic complications Relationship between chronic complications and blood glucose control    Diabetes Stress and Support Identified and addressed  patients feelings and concerns about diabetes;Worked with patient to identify barriers to care and solutions;Role of stress on diabetes    Preconception care Role of family planning for patients with diabetes      Individualized Goals (developed by patient)   Nutrition Carb counting    Physical Activity Exercise 5-7 days per week;60 minutes per day    Medications take my medication as prescribed    Monitoring  Consistenly use CGM    Problem Solving Eating Pattern;Addressing barriers to behavior change    Reducing Risk examine blood glucose patterns;check ketones if blood glucose over 240mg /dL;treat hypoglycemia with 15 grams of carbs if blood glucose less than 70mg /dL      Post-Education Assessment   Patient understands the diabetes disease and treatment process. Demonstrates understanding / competency    Patient understands incorporating nutritional management into lifestyle. Needs Review    Patient undertands incorporating physical activity into lifestyle. Comprehends key points    Patient understands using medications safely. Needs Review    Patient understands monitoring blood glucose, interpreting and using results Comprehends key points    Patient understands prevention, detection, and treatment of acute complications. Comprehends key points    Patient understands prevention, detection,  and treatment of chronic complications. Comprehends key points    Patient understands how to develop strategies to address psychosocial issues. Comprehends key points    Patient understands how to develop strategies to promote health/change behavior. Needs Review      Outcomes   Expected Outcomes Demonstrated interest in learning but significant barriers to change    Future DMSE 4-6 wks    Program Status Not Completed          Individualized Plan for Diabetes Self-Management Training:   Learning Objective:  Patient will have a greater understanding of diabetes self-management. Patient education plan is to attend individual and/or group sessions per assessed needs and concerns.   Plan:   Patient Instructions  Multivitamin daily that includes Vitamin D (2000 units) Always wear your dexcom Advocate for yourself! Always bolus for any carb that you eat before the meal or snack.    Expected Outcomes:  Demonstrated interest in learning but significant barriers to change  Education material provided: Meal plan card and Diabetes Resources  If problems or questions, patient to contact team via:  Phone  Future DSME appointment: 4-6 wks

## 2024-05-02 NOTE — Patient Instructions (Addendum)
 Multivitamin daily that includes Vitamin D (2000 units) Always wear your dexcom Advocate for yourself! Always bolus for any carb that you eat before the meal or snack.

## 2024-05-17 ENCOUNTER — Other Ambulatory Visit (INDEPENDENT_AMBULATORY_CARE_PROVIDER_SITE_OTHER): Payer: Self-pay

## 2024-05-17 DIAGNOSIS — E1065 Type 1 diabetes mellitus with hyperglycemia: Secondary | ICD-10-CM

## 2024-05-17 DIAGNOSIS — Z978 Presence of other specified devices: Secondary | ICD-10-CM

## 2024-05-19 ENCOUNTER — Telehealth: Payer: Self-pay | Admitting: Dietician

## 2024-05-19 NOTE — Telephone Encounter (Signed)
 Called patient to adjust her pump settings. Current ICR 100 changed from 120 last visit. Dexcom clarity reviewed.  She has had a small improvement in overall numbers with a GMI of 8.3% decreased from 8.9% on 05/02/2024.  Patient was not available.  Left a message stating that I will send a My Chart message with the information but to call if she cannot see it [phone].  Leita Constable, RD, LDN, CDCES, DipACLM

## 2024-05-24 ENCOUNTER — Telehealth: Payer: Self-pay | Admitting: Dietician

## 2024-05-24 NOTE — Telephone Encounter (Signed)
 Called patient to follow up re:  pump adjustments.  Patient was not available.  Left message for her to call [phone].  Leita Constable, RD, LDN, CDCES, DipACLM

## 2024-06-06 ENCOUNTER — Encounter: Admitting: Dietician

## 2024-06-06 DIAGNOSIS — E1065 Type 1 diabetes mellitus with hyperglycemia: Secondary | ICD-10-CM | POA: Diagnosis present

## 2024-06-06 NOTE — Patient Instructions (Signed)
 Put more insulin  in the POD so that it lasts the full 3 days. Consistently change the POD when due. Continue to bolus 15 minutes before your meals. Small amounts of protein with meals and snacks.

## 2024-06-06 NOTE — Progress Notes (Signed)
 "  Diabetes Self-Management Education  Visit Type: Follow-up  Appt. Start Time: 0915 Appt. End Time: 0945  06/06/2024  Diane [name], identified by name and date of birth, is a 23 y.o. female with a diagnosis of Diabetes: Type 1.   ASSESSMENT Patient is here today alone.  She was last seen by this RD on 05/02/2024.  I attempted to reach out to patient a couple of times since her last appointment.  Discussed this.  She states that it is best to communicate via text.  Blood glucose has improved in the past month.  We discussed issues that are still affecting control such as not changing her POD on time.  She states that  the POD runs out of insulin  at about 2 days.  Instructed her to increase the amount of insulin  that she puts in the POD to allow it to last the full 3 days. Patient changed her ICR from 100 to 23 today in the office. Clarity report was reviewed with the patient.  Patient told me where she was not wearing a pod and was able to identify the cause of some of her lows.  She occasionally boluses after a meal.  Discussed she would need to decrease the carbs that she enters if she is bolusing after a meal and to aim to bolus prior to the meal to have better blood glucose control and avoid hypoglycemia. She reports her carb counting skills are a 7/10.  Referral:  Type 1 Diabetes - poorly controlled on an Omnipod pump - setting adjustments needed.   Type 1 Diabetes (2021 and Omnipod 2022), asthma Medications include:  inulin lispro via pump Labs noted to include:  A1C  11.7% 02/16/2024 decreased from >16.9% on 11/03/2023, vitamin D19.6 on 12/15/2023 Pump:  Omnipod 5 Current settings:             Basal rate 12 AM - 1 PM:  0.5 units/hr                               1 PM- 12 AM:  1 unit per hour             ICR 100 >> 85 (changed at visit)               ISF 50             Duration of insulin  action 4             Target 120             Correct above 70   Dexcom CGM - patient shares  with mom   CGM Results from download:  05/02/2021 06/06/24  % Time CGM active:   81 %   (Goal >70%) 89  Average glucose:   235 mg/dL for 14 days [id number]  Glucose management indicator:   8.9 % 7.5  Time in range (70-180 mg/dL):   42 %   (Goal >29%) 59  Time High (181-250 mg/dL):   24 %   (Goal < 74%) 25  Time Very High (>250 mg/dL):    34 %   (Goal < 5%) 15  Time Low (54-69 mg/dL):   0 %   (Goal <5%) 1  Time Very Low (<54 mg/dL):   0 %   (Goal <8%) <1  %CV (glucose variability)    42 %  (Goal <36%) 41.9    67.5 133 lbs 04/01/2024  Patient is going to [employer] in the study of kinesiology.  Her parents and 4 siblings live locally and she frequently eats with them or out to eat.  She does not eat in the dorm. She works at [employer] - 1st shift.    Diabetes Self-Management Education - 06/06/24 1153       Visit Information   Visit Type Follow-up      Initial Visit   Diabetes Type Type 1    Date Diagnosed 2021    Are you currently following a meal plan? Yes    What type of meal plan do you follow? carb counting    Are you taking your medications as prescribed? No   but improved     Psychosocial Assessment   Patient Belief/Attitude about Diabetes Motivated to manage diabetes    Self-care barriers None    Self-management support Doctor's office;CDE visits;Family    Other persons present Patient    Patient Concerns Glycemic Control;Problem Solving;Nutrition/Meal planning    Special Needs None    Preferred Learning Style No preference indicated    Learning Readiness Ready    How often do you need to have someone help you when you read instructions, pamphlets, or other written materials from your doctor or pharmacy? 1 - Never      Pre-Education Assessment   Patient understands the diabetes disease and treatment process. Demonstrates understanding / competency    Patient understands incorporating nutritional management into lifestyle. Comprehends key points    Patient undertands  incorporating physical activity into lifestyle. Comprehends key points    Patient understands using medications safely. Needs Review    Patient understands monitoring blood glucose, interpreting and using results Needs Review    Patient understands prevention, detection, and treatment of acute complications. Needs Review    Patient understands prevention, detection, and treatment of chronic complications. Compreheands key points    Patient understands how to develop strategies to address psychosocial issues. Comprehends key points    Patient understands how to develop strategies to promote health/change behavior. Needs Review      Complications   How often do you check your blood sugar? > 4 times/day    Fasting Blood glucose range (mg/dL) 29-870;869-820    Postprandial Blood glucose range (mg/dL) 819-799;>799;869-820    Number of hypoglycemic episodes per month 6    Can you tell when your blood sugar is low? Yes    What do you do if your blood sugar is low? drinks juice      Dietary Intake   Breakfast french toast, regular syrup    Snack (morning) none    Lunch none    Snack (afternoon) none    Dinner [employer] 10 piece chicken nugget, fries, frosty    Snack (evening) none    Beverage(s) water , juice      Patient Education   Previous Diabetes Education Yes   05/02/2024   Medications Taught/reviewed insulin /injectables, injection, site rotation, insulin /injectables storage and needle disposal.    Monitoring Taught/evaluated CGM (comment)    Diabetes Stress and Support Identified and addressed patients feelings and concerns about diabetes;Worked with patient to identify barriers to care and solutions      Individualized Goals (developed by patient)   Nutrition General guidelines for healthy choices and portions discussed    Physical Activity Exercise 5-7 days per week;60 minutes per day    Medications take my medication as prescribed    Monitoring  Consistenly use CGM    Problem  Solving  Medication consistency;Addressing barriers to behavior change    Reducing Risk examine blood glucose patterns;treat hypoglycemia with 15 grams of carbs if blood glucose less than 70mg /dL      Patient Self-Evaluation of Goals - Patient rates self as meeting previously set goals (% of time)   Nutrition >75% (most of the time)    Physical Activity >75% (most of the time)    Medications >75% (most of the time)    Monitoring >75% (most of the time)    Problem Solving and behavior change strategies  50 - 75 % (half of the time)    Reducing Risk (treating acute and chronic complications) 50 - 75 % (half of the time)    Health Coping 50 - 75 % (half of the time)      Post-Education Assessment   Patient understands the diabetes disease and treatment process. Demonstrates understanding / competency    Patient understands incorporating nutritional management into lifestyle. Comprehends key points    Patient undertands incorporating physical activity into lifestyle. Comprehends key points    Patient understands using medications safely. Needs Review    Patient understands monitoring blood glucose, interpreting and using results Needs Review    Patient understands prevention, detection, and treatment of acute complications. Comprehends key points    Patient understands prevention, detection, and treatment of chronic complications. Comprehends key points    Patient understands how to develop strategies to address psychosocial issues. Comprehends key points    Patient understands how to develop strategies to promote health/change behavior. Needs Review      Outcomes   Expected Outcomes Demonstrated interest in learning. Expect positive outcomes    Future DMSE 4-6 wks    Program Status Not Completed      Subsequent Visit   Since your last visit have you continued or begun to take your medications as prescribed? No   but improved         Individualized Plan for Diabetes Self-Management  Training:   Learning Objective:  Patient will have a greater understanding of diabetes self-management. Patient education plan is to attend individual and/or group sessions per assessed needs and concerns.   Plan:   Patient Instructions  Put more insulin  in the POD so that it lasts the full 3 days. Consistently change the POD when due. Continue to bolus 15 minutes before your meals. Small amounts of protein with meals and snacks.    Expected Outcomes:  Demonstrated interest in learning. Expect positive outcomes  Education material provided:   If problems or questions, patient to contact team via:  Phone  Future DSME appointment: 4-6 wks "

## 2024-07-18 ENCOUNTER — Encounter: Admitting: Dietician
